# Patient Record
Sex: Male | Born: 1950 | Race: White | Hispanic: No | Marital: Married | Smoking: Never smoker
Health system: Southern US, Community
[De-identification: ages and names within clinical notes are randomized; demographics above are authoritative.]

## PROBLEM LIST (undated history)

## (undated) DIAGNOSIS — E785 Hyperlipidemia, unspecified: Secondary | ICD-10-CM

## (undated) DIAGNOSIS — I10 Essential (primary) hypertension: Secondary | ICD-10-CM

## (undated) DIAGNOSIS — D329 Benign neoplasm of meninges, unspecified: Secondary | ICD-10-CM

## (undated) DIAGNOSIS — J189 Pneumonia, unspecified organism: Secondary | ICD-10-CM

## (undated) DIAGNOSIS — G4734 Idiopathic sleep related nonobstructive alveolar hypoventilation: Secondary | ICD-10-CM

## (undated) DIAGNOSIS — I2510 Atherosclerotic heart disease of native coronary artery without angina pectoris: Secondary | ICD-10-CM

## (undated) DIAGNOSIS — D72829 Elevated white blood cell count, unspecified: Secondary | ICD-10-CM

## (undated) DIAGNOSIS — F439 Reaction to severe stress, unspecified: Secondary | ICD-10-CM

## (undated) DIAGNOSIS — Y95 Nosocomial condition: Secondary | ICD-10-CM

## (undated) DIAGNOSIS — I255 Ischemic cardiomyopathy: Secondary | ICD-10-CM

## (undated) HISTORY — DX: Nosocomial condition: Y95

## (undated) HISTORY — DX: Benign neoplasm of meninges, unspecified: D32.9

## (undated) HISTORY — DX: Pneumonia, unspecified organism: J18.9

## (undated) HISTORY — PX: COLONOSCOPY: SHX174

---

## 2001-11-11 ENCOUNTER — Ambulatory Visit (HOSPITAL_COMMUNITY): Admission: RE | Admit: 2001-11-11 | Discharge: 2001-11-11 | Payer: Self-pay | Admitting: Gastroenterology

## 2013-02-07 ENCOUNTER — Other Ambulatory Visit: Payer: Self-pay | Admitting: Family Medicine

## 2013-02-07 DIAGNOSIS — M5410 Radiculopathy, site unspecified: Secondary | ICD-10-CM

## 2013-02-11 ENCOUNTER — Other Ambulatory Visit: Payer: Self-pay

## 2013-02-28 HISTORY — PX: CARDIAC CATHETERIZATION: SHX172

## 2013-03-27 ENCOUNTER — Inpatient Hospital Stay (HOSPITAL_COMMUNITY)
Admission: EM | Admit: 2013-03-27 | Discharge: 2013-03-29 | DRG: 282 | Disposition: A | Discharge status: Home / Self Care | Payer: Managed Care, Other (non HMO) | Attending: Cardiovascular Disease | Admitting: Cardiovascular Disease

## 2013-03-27 ENCOUNTER — Emergency Department (HOSPITAL_COMMUNITY): Payer: Managed Care, Other (non HMO)

## 2013-03-27 ENCOUNTER — Encounter (HOSPITAL_COMMUNITY): Payer: Self-pay | Admitting: Emergency Medicine

## 2013-03-27 DIAGNOSIS — Z639 Problem related to primary support group, unspecified: Secondary | ICD-10-CM

## 2013-03-27 DIAGNOSIS — Z87891 Personal history of nicotine dependence: Secondary | ICD-10-CM

## 2013-03-27 DIAGNOSIS — Z7982 Long term (current) use of aspirin: Secondary | ICD-10-CM

## 2013-03-27 DIAGNOSIS — F439 Reaction to severe stress, unspecified: Secondary | ICD-10-CM

## 2013-03-27 DIAGNOSIS — Z566 Other physical and mental strain related to work: Secondary | ICD-10-CM

## 2013-03-27 DIAGNOSIS — Z79899 Other long term (current) drug therapy: Secondary | ICD-10-CM

## 2013-03-27 DIAGNOSIS — Z809 Family history of malignant neoplasm, unspecified: Secondary | ICD-10-CM

## 2013-03-27 DIAGNOSIS — Z880 Allergy status to penicillin: Secondary | ICD-10-CM

## 2013-03-27 DIAGNOSIS — E876 Hypokalemia: Secondary | ICD-10-CM | POA: Diagnosis present

## 2013-03-27 DIAGNOSIS — E785 Hyperlipidemia, unspecified: Secondary | ICD-10-CM | POA: Diagnosis present

## 2013-03-27 DIAGNOSIS — I2510 Atherosclerotic heart disease of native coronary artery without angina pectoris: Secondary | ICD-10-CM | POA: Diagnosis present

## 2013-03-27 DIAGNOSIS — I10 Essential (primary) hypertension: Secondary | ICD-10-CM | POA: Diagnosis present

## 2013-03-27 DIAGNOSIS — Z889 Allergy status to unspecified drugs, medicaments and biological substances status: Secondary | ICD-10-CM

## 2013-03-27 DIAGNOSIS — Z569 Unspecified problems related to employment: Secondary | ICD-10-CM

## 2013-03-27 DIAGNOSIS — Z23 Encounter for immunization: Secondary | ICD-10-CM

## 2013-03-27 DIAGNOSIS — D72829 Elevated white blood cell count, unspecified: Secondary | ICD-10-CM | POA: Diagnosis present

## 2013-03-27 DIAGNOSIS — I214 Non-ST elevation (NSTEMI) myocardial infarction: Principal | ICD-10-CM | POA: Diagnosis present

## 2013-03-27 HISTORY — DX: Reaction to severe stress, unspecified: F43.9

## 2013-03-27 HISTORY — DX: Idiopathic sleep related nonobstructive alveolar hypoventilation: G47.34

## 2013-03-27 HISTORY — DX: Hyperlipidemia, unspecified: E78.5

## 2013-03-27 HISTORY — DX: Atherosclerotic heart disease of native coronary artery without angina pectoris: I25.10

## 2013-03-27 HISTORY — DX: Essential (primary) hypertension: I10

## 2013-03-27 HISTORY — DX: Elevated white blood cell count, unspecified: D72.829

## 2013-03-27 HISTORY — DX: Ischemic cardiomyopathy: I25.5

## 2013-03-27 LAB — COMPREHENSIVE METABOLIC PANEL
ALT: 23 U/L (ref 0–53)
Albumin: 3.7 g/dL (ref 3.5–5.2)
Alkaline Phosphatase: 102 U/L (ref 39–117)
BUN: 15 mg/dL (ref 6–23)
CO2: 27 mEq/L (ref 19–32)
Chloride: 98 mEq/L (ref 96–112)
GFR calc Af Amer: 88 mL/min — ABNORMAL LOW (ref >90)
GFR calc non Af Amer: 76 mL/min — ABNORMAL LOW (ref >90)
Glucose, Bld: 154 mg/dL — ABNORMAL HIGH (ref 70–99)
Potassium: 3.9 mEq/L (ref 3.5–5.1)
Sodium: 136 mEq/L (ref 135–145)
Total Bilirubin: 0.5 mg/dL (ref 0.3–1.2)

## 2013-03-27 LAB — CBC WITH DIFFERENTIAL/PLATELET
Basophils Relative: 0 % (ref 0–1)
HCT: 41.4 % (ref 39.0–52.0)
Hemoglobin: 14.8 g/dL (ref 13.0–17.0)
Lymphocytes Relative: 23 % (ref 12–46)
Lymphs Abs: 2.7 10*3/uL (ref 0.7–4.0)
MCH: 31.2 pg (ref 26.0–34.0)
Monocytes Absolute: 0.6 10*3/uL (ref 0.1–1.0)
Monocytes Relative: 5 % (ref 3–12)
Neutro Abs: 8.3 10*3/uL — ABNORMAL HIGH (ref 1.7–7.7)
Neutrophils Relative %: 71 % (ref 43–77)
Platelets: 280 10*3/uL (ref 150–400)
RBC: 4.75 MIL/uL (ref 4.22–5.81)

## 2013-03-27 LAB — POCT I-STAT TROPONIN I

## 2013-03-27 LAB — HEMOGLOBIN A1C: Hgb A1c MFr Bld: 5.6 % (ref <5.7)

## 2013-03-27 LAB — TROPONIN I
Troponin I: 0.75 ng/mL (ref <0.30)
Troponin I: 3.75 ng/mL (ref <0.30)
Troponin I: 2.84 ng/mL (ref <0.30)

## 2013-03-27 LAB — MRSA PCR SCREENING: MRSA by PCR: NEGATIVE

## 2013-03-27 LAB — TSH: TSH: 1.239 u[IU]/mL (ref 0.350–4.500)

## 2013-03-27 MED ORDER — ACETAMINOPHEN 325 MG PO TABS
650.0000 mg | ORAL_TABLET | ORAL | Status: DC | PRN
  Administered 2013-03-27 (×2): 650 mg via ORAL
  Filled 2013-03-27 (×2): qty 2

## 2013-03-27 MED ORDER — ASPIRIN EC 81 MG PO TBEC
81.0000 mg | DELAYED_RELEASE_TABLET | Freq: Every day | ORAL | Status: DC
  Filled 2013-03-27: qty 1

## 2013-03-27 MED ORDER — HEPARIN BOLUS VIA INFUSION
2500.0000 [IU] | Freq: Once | INTRAVENOUS | Status: AC
  Administered 2013-03-27: 2500 [IU] via INTRAVENOUS
  Filled 2013-03-27: qty 2500

## 2013-03-27 MED ORDER — SODIUM CHLORIDE 0.9 % IV SOLN
INTRAVENOUS | Status: DC
Start: 2013-03-27 — End: 2013-03-29

## 2013-03-27 MED ORDER — NITROGLYCERIN IN D5W 200-5 MCG/ML-% IV SOLN
2.0000 ug/min | INTRAVENOUS | Status: DC
  Administered 2013-03-27: 5 ug/min via INTRAVENOUS

## 2013-03-27 MED ORDER — ASPIRIN 81 MG PO CHEW
324.0000 mg | CHEWABLE_TABLET | Freq: Once | ORAL | Status: AC
  Administered 2013-03-27: 324 mg via ORAL
  Filled 2013-03-27: qty 4

## 2013-03-27 MED ORDER — ATORVASTATIN CALCIUM 80 MG PO TABS
80.0000 mg | ORAL_TABLET | Freq: Every day | ORAL | Status: DC
  Administered 2013-03-27 – 2013-03-28 (×2): 80 mg via ORAL
  Filled 2013-03-27 (×3): qty 1

## 2013-03-27 MED ORDER — TRAMADOL HCL 50 MG PO TABS: 50.0000 mg | ORAL_TABLET | Freq: Four times a day (QID) | ORAL | Status: DC | PRN

## 2013-03-27 MED ORDER — ONDANSETRON HCL 4 MG/2ML IJ SOLN
4.0000 mg | Freq: Four times a day (QID) | INTRAMUSCULAR | Status: DC | PRN
  Administered 2013-03-27 – 2013-03-28 (×2): 4 mg via INTRAVENOUS
  Filled 2013-03-27 (×2): qty 2

## 2013-03-27 MED ORDER — NITROGLYCERIN IN D5W 200-5 MCG/ML-% IV SOLN: 10.0000 ug/min | INTRAVENOUS | Status: DC

## 2013-03-27 MED ORDER — ADULT MULTIVITAMIN W/MINERALS CH
1.0000 | ORAL_TABLET | Freq: Every day | ORAL | Status: DC
  Administered 2013-03-27 – 2013-03-28 (×2): 1 via ORAL
  Filled 2013-03-27 (×3): qty 1

## 2013-03-27 MED ORDER — HEPARIN (PORCINE) IN NACL 100-0.45 UNIT/ML-% IJ SOLN
1500.0000 [IU]/h | INTRAMUSCULAR | Status: DC
  Administered 2013-03-27: 1000 [IU]/h via INTRAVENOUS
  Administered 2013-03-28 (×2): 1500 [IU]/h via INTRAVENOUS
  Filled 2013-03-27 (×4): qty 250

## 2013-03-27 MED ORDER — OXYCODONE-ACETAMINOPHEN 5-325 MG PO TABS
1.0000 | ORAL_TABLET | Freq: Four times a day (QID) | ORAL | Status: DC | PRN
Start: 2013-03-27 — End: 2013-03-29
  Administered 2013-03-28: 2 via ORAL
  Filled 2013-03-27: qty 2

## 2013-03-27 MED ORDER — ASPIRIN 81 MG PO CHEW
81.0000 mg | CHEWABLE_TABLET | ORAL | Status: DC
  Filled 2013-03-27: qty 1

## 2013-03-27 MED ORDER — SODIUM CHLORIDE 0.9 % IV SOLN: 1.0000 mL/kg/h | INTRAVENOUS | Status: DC

## 2013-03-27 MED ORDER — MORPHINE SULFATE 2 MG/ML IJ SOLN
2.0000 mg | INTRAMUSCULAR | Status: DC | PRN
  Administered 2013-03-27: 2 mg via INTRAVENOUS
  Filled 2013-03-27: qty 1

## 2013-03-27 MED ORDER — HEPARIN BOLUS VIA INFUSION
4000.0000 [IU] | Freq: Once | INTRAVENOUS | Status: AC
  Administered 2013-03-27: 4000 [IU] via INTRAVENOUS
  Filled 2013-03-27: qty 4000

## 2013-03-27 MED ORDER — METOPROLOL TARTRATE 25 MG PO TABS
25.0000 mg | ORAL_TABLET | Freq: Two times a day (BID) | ORAL | Status: DC
  Administered 2013-03-27 – 2013-03-28 (×4): 25 mg via ORAL
  Filled 2013-03-27 (×6): qty 1

## 2013-03-27 MED ORDER — NITROGLYCERIN 0.4 MG SL SUBL: 0.4000 mg | SUBLINGUAL_TABLET | SUBLINGUAL | Status: DC | PRN

## 2013-03-27 MED ORDER — INFLUENZA VAC SPLIT QUAD 0.5 ML IM SUSP
0.5000 mL | INTRAMUSCULAR | Status: AC
  Administered 2013-03-28: 0.5 mL via INTRAMUSCULAR
  Filled 2013-03-27: qty 0.5

## 2013-03-27 MED ORDER — ASPIRIN EC 81 MG PO TBEC
81.0000 mg | DELAYED_RELEASE_TABLET | Freq: Every day | ORAL | Status: DC
  Administered 2013-03-29: 81 mg via ORAL
  Filled 2013-03-27: qty 1

## 2013-03-27 MED ORDER — LISINOPRIL 20 MG PO TABS
20.0000 mg | ORAL_TABLET | Freq: Every day | ORAL | Status: DC
  Administered 2013-03-27 – 2013-03-29 (×3): 20 mg via ORAL
  Filled 2013-03-27 (×3): qty 1

## 2013-03-27 MED ORDER — ALPRAZOLAM 0.25 MG PO TABS
0.2500 mg | ORAL_TABLET | Freq: Two times a day (BID) | ORAL | Status: DC | PRN
  Administered 2013-03-27: 0.25 mg via ORAL
  Filled 2013-03-27 (×2): qty 1

## 2013-03-27 MED ORDER — NITROGLYCERIN 0.4 MG SL SUBL
0.4000 mg | SUBLINGUAL_TABLET | SUBLINGUAL | Status: AC | PRN
  Administered 2013-03-27 (×3): 0.4 mg via SUBLINGUAL
  Filled 2013-03-27: qty 25

## 2013-03-27 MED ORDER — NITROGLYCERIN IN D5W 200-5 MCG/ML-% IV SOLN
INTRAVENOUS | Status: AC
  Filled 2013-03-27: qty 250

## 2013-03-27 NOTE — Progress Notes (Signed)
ANTICOAGULATION CONSULT NOTE - Follow Up Consult  Pharmacy Consult for heparin Indication: chest pain/ACS  Allergies  Allergen Reactions  . Codeine Rash  . Penicillins Rash  . Sulfa Antibiotics Rash    Patient Measurements: Height: 5\' 9"  (175.3 cm) Weight: 189 lb 13.1 oz (86.1 kg) IBW/kg (Calculated) : 70.7 Heparin Dosing Weight: 86 kg  Vital Signs: Temp: 97.8 F (36.6 C) (12/28 1600) Temp src: Oral (12/28 1600) BP: 140/84 mmHg (12/28 1900) Pulse Rate: 82 (12/28 1900)  Labs:  Recent Labs  03/27/13 0546 03/27/13 0630 03/27/13 1313 03/27/13 1728  HGB 14.8  --   --   --   HCT 41.4  --   --   --   PLT 280  --   --   --   APTT  --  30  --   --   HEPARINUNFRC  --   --   --  <0.10*  CREATININE 1.02  --   --   --   TROPONINI 0.75*  --  3.75*  --     Estimated Creatinine Clearance: 80.6 ml/min (by C-G formula based on Cr of 1.02).  Assessment: Patient is a 62 y.o M on heparin for NSTEMI with plan for cath on 12/29.  First heparin level now back undetectable.  Per RN, no issues with IV line and no bleeding noted.  Goal of Therapy:  Heparin level 0.3-0.7 units/ml Monitor platelets by anticoagulation protocol: Yes   Plan:  1) heparin 2500 units IV bolus x1, then increase drip to 1250 units/hr 2) check 6 hour heparin level  Tareka Jhaveri P 03/27/2013,7:48 PM

## 2013-03-27 NOTE — ED Provider Notes (Signed)
CSN: [id number]     Arrival date & time 03/27/13  0536 History   First MD Initiated Contact with Patient 03/27/13 [phone]     Chief Complaint  Patient presents with  . Chest Pain   (Consider location/radiation/quality/duration/timing/severity/associated sxs/prior Treatment) HPI Comments: 62 year old male presents with chest pressure since 11 PM last night (about 7 hours prior to arrival). Patient states that the pressure seemed eventually wane enough for him to go to sleep. When he woke up his pain had returned. About an hour after arrival he was walking down the stairs and the pain worsened, he felt dizzy and became pale and diaphoretic. Never had nausea or shortness of breath. The pressure is better than it was before prescribe a 7/10. His history hypertension. He also has a history of hyperlipidemia but states this was controlled by diet and exercise. He's never had any heart problems before.   Past Medical History  Diagnosis Date  . Hypertension    History reviewed. No pertinent past surgical history. No family history on file. History  Substance Use Topics  . Smoking status: Never Smoker   . Smokeless tobacco: Not on file  . Alcohol Use: Not on file    Review of Systems  Constitutional: Positive for diaphoresis. Negative for fever and chills.  Respiratory: Negative for shortness of breath.   Cardiovascular: Positive for chest pain. Negative for leg swelling.  Gastrointestinal: Negative for nausea, vomiting and abdominal pain.  All other systems reviewed and are negative.    Allergies  Codeine; Penicillins; and Sulfa antibiotics  Home Medications   Current Outpatient Rx  Name  Route  Sig  Dispense  Refill  . aspirin EC 81 MG tablet   Oral   Take 81 mg by mouth daily.         Marland Kitchen lisinopril (PRINIVIL,ZESTRIL) 20 MG tablet   Oral   Take 20 mg by mouth daily.         . Multiple Vitamin (MULTIVITAMIN WITH MINERALS) TABS tablet   Oral   Take 1 tablet by mouth daily.           BP 134/90  Pulse 71  Temp(Src) 97.8 F (36.6 C) (Oral)  Resp 20  Wt 190 lb (86.183 kg)  SpO2 96% Physical Exam  Nursing note and vitals reviewed. Constitutional: He is oriented to person, place, and time. He appears well-developed and well-nourished. No distress.  HENT:  Head: Normocephalic and atraumatic.  Right Ear: External ear normal.  Left Ear: External ear normal.  Nose: Nose normal.  Eyes: Right eye exhibits no discharge. Left eye exhibits no discharge.  Neck: Neck supple.  Cardiovascular: Normal rate, regular rhythm, normal heart sounds and intact distal pulses.   Pulmonary/Chest: Effort normal and breath sounds normal. He exhibits no tenderness.  Abdominal: Soft. There is no tenderness.  Musculoskeletal: He exhibits no edema.  Neurological: He is alert and oriented to person, place, and time.  Skin: Skin is warm and dry.    ED Course  Procedures (including critical care time) Labs Review Labs Reviewed  CBC WITH DIFFERENTIAL - Abnormal; Notable for the following:    WBC 11.6 (*)    Neutro Abs 8.3 (*)    All other components within normal limits  COMPREHENSIVE METABOLIC PANEL - Abnormal; Notable for the following:    Glucose, Bld 154 (*)    GFR calc non Af Amer 76 (*)    GFR calc Af Amer 88 (*)    All other components within normal  limits  POCT I-STAT TROPONIN I - Abnormal; Notable for the following:    Troponin i, poc 0.43 (*)    All other components within normal limits  TROPONIN I  APTT  HEPARIN LEVEL (UNFRACTIONATED)   Imaging Review No results found.  EKG Interpretation    Date/Time:  Sunday March 27 2013 06:17:56 EST Ventricular Rate:  71 PR Interval:  169 QRS Duration: 109 QT Interval:  403 QTC Calculation: 438 R Axis:   -32 Text Interpretation:  Sinus rhythm Left axis deviation Inferior Q waves noted Borderline T wave abnormalities No old tracing to compare Confirmed by Thirza Pellicano  MD, Fernado Brigante ([id number]) on 03/27/2013 6:26:28  AM           CRITICAL CARE Performed by: Pricilla Loveless T   Total critical care time: 45 minutes  Critical care time was exclusive of separately billable procedures and treating other patients.  Critical care was necessary to treat or prevent imminent or life-threatening deterioration.  Critical care was time spent personally by me on the following activities: development of treatment plan with patient and/or surrogate as well as nursing, discussions with consultants, evaluation of patient's response to treatment, examination of patient, obtaining history from patient or surrogate, ordering and performing treatments and interventions, ordering and review of laboratory studies, ordering and review of radiographic studies, pulse oximetry and re-evaluation of patient's condition.  MDM   1. NSTEMI (non-ST elevated myocardial infarction)    6:25 Patient's EKG nonspecific with inferior Q waves. No old. No signs of STEMI. Patient appears mildly uncomfortable, pain 7/10. Given his elevated troponin, this is c/w NSTEMI. His pain has improved since 5:00 AM on it's own. Will consult cards, given ASA and will give nitro and start on heparin.  7:05 cards paged again, still no response yet. Patient's pain has improved.  7:40 Patient's pain "gone". Patient appears comfortable. Cards repaged, no response yet.  7:55 Discussed with PA Ward Givens who agrees with treatment so far and accepts patient to go to [facility] CCU.  Audree Camel, MD 03/27/13 [phone]

## 2013-03-27 NOTE — ED Notes (Signed)
[facility] called and report given. °

## 2013-03-27 NOTE — ED Notes (Signed)
Pt states started with chest pain at 2300; continued thru night; described as pressure; states this morning walked downstairs got dizzy and almost fainted; pale and diaphoretic at that time per wife; same on arrival; no previous history of same

## 2013-03-27 NOTE — ED Notes (Signed)
Dr Criss Alvine notified of troponin .75; no orders given

## 2013-03-27 NOTE — Progress Notes (Signed)
ANTICOAGULATION CONSULT NOTE - Initial Consult  Pharmacy Consult for Heparin Indication: chest pain/ACS  Allergies  Allergen Reactions  . Codeine Rash  . Penicillins Rash  . Sulfa Antibiotics Rash    Patient Measurements: Weight: 190 lb (86.183 kg) Heparin Dosing Weight:   Vital Signs: Temp: 97.8 F (36.6 C) (12/28 0600) Temp src: Oral (12/28 0600) BP: 134/90 mmHg (12/28 0543) Pulse Rate: 71 (12/28 0543)  Labs:  Recent Labs  03/27/13 0546  HGB 14.8  HCT 41.4  PLT 280  CREATININE 1.02    CrCl is unknown because there is no height on file for the current visit.   Medical History: Past Medical History  Diagnosis Date  . Hypertension     Medications:  Infusions:  . heparin    . heparin      Assessment: Patient with CP.  No order anticoagulants noted on Med rec.  Goal of Therapy:  Heparin level 0.3-0.7 units/ml Monitor platelets by anticoagulation protocol: Yes   Plan:  Heparin bolus 4000 units iv x1 Heparin drip at 1000 units/hr Daily CBC/Heparin level Next heparin level at 1500.  Darlina Guys, Jacquenette Shone Crowford 03/27/2013,6:37 AM

## 2013-03-27 NOTE — H&P (Signed)
Patient ID: Christian Ball MRN: [id number], DOB/AGE: Aug 13, 1950   Admit date: 03/27/2013  Primary Physician: No primary provider on file. Primary Cardiologist: new to    Pt. Profile:  62 y/o male w/o prior cardiac hx who presented to [facility] ED today with chest pain and has been found to have an elevated troponin.   Problem List  Past Medical History  Diagnosis Date  . Hypertension   . Hyperlipidemia     History reviewed. No pertinent past surgical history.   Allergies  Allergies  Allergen Reactions  . Codeine Rash  . Penicillins Rash  . Sulfa Antibiotics Rash   HPI  62 y/o male w/o prior cardiac hx.  He was in his usoh until last night around 11pm, when he noted sscp with belching, which he thought was related to indigestion.  This resolved within 15 minutes and he was able to fall off to sleep.  He awoke at about 5 AM needing to use the bathroom and upon returning to bed, he noted recurrent sscp.  He went and sat down and initially felt a little better but then developed diaphoresis, nausea, and presyncope.  At that point, he alerted his family who brought him to the ER.  There, he was treated with 3 sl ntg with complete relief of chest pain.  ECG showed inf q's.  Initial POC troponin was elevated at 0.43.  Subsequent troponin sent to lab returned at 0.75.   He was placed on heparin and transferred to [facility] CCU for admission.  Here, he is currently pain free.  Home Medications  Prior to Admission medications   Medication Sig Start Date End Date Taking? Authorizing Provider  aspirin EC 81 MG tablet Take 81 mg by mouth daily.   Yes Historical Provider, MD  lisinopril (PRINIVIL,ZESTRIL) 20 MG tablet Take 20 mg by mouth daily.   Yes Historical Provider, MD  Multiple Vitamin (MULTIVITAMIN WITH MINERALS) TABS tablet Take 1 tablet by mouth daily.   Yes Historical Provider, MD   Family History  Family History  Problem Relation Age of Onset  . Cancer Mother     died at 34  . GI  Bleed Father     died at 33   Social History  History   Social History  . Marital Status: Married    Spouse Name: N/A    Number of Children: N/A  . Years of Education: N/A   Occupational History  . Not on file.   Social History Main Topics  . Smoking status: Former Games developer  . Smokeless tobacco: Not on file     Comment: has had occasional cigar.  . Alcohol Use: Yes     Comment: 2 drinks/year  . Drug Use: No  . Sexual Activity: Yes   Other Topics Concern  . Not on file   Social History Narrative   Lives in [place] with his wife.  He works in Airline pilot - high stress.  Not currently exercising on routine basis.    Review of Systems General:  No chills, fever, night sweats or weight changes.  Cardiovascular:  +++ chest pain, diaphoresis, nausea, presyncope this AM.  No h/o dyspnea on exertion, edema, orthopnea, palpitations, paroxysmal nocturnal dyspnea. Dermatological: No rash, lesions/masses Respiratory: No cough, dyspnea Urologic: No hematuria, dysuria Abdominal:   No nausea, vomiting, diarrhea, bright red blood per rectum, melena, or hematemesis Neurologic:  No visual changes, wkns, changes in mental status. All other systems reviewed and are otherwise negative except as  noted above.  Physical Exam  Blood pressure 120/81, pulse 76, temperature 97.8 F (36.6 C), temperature source Oral, resp. rate 18, height 5\' 9"  (1.753 m), weight 190 lb (86.183 kg), SpO2 98.00%.  General: Pleasant, NAD Psych: Normal affect. Neuro: Alert and oriented X 3. Moves all extremities spontaneously. HEENT: Normal  Neck: Supple without bruits or JVD. Lungs:  Resp regular and unlabored, CTA. Heart: RRR no s3, s4, or murmurs. Abdomen: Soft, non-tender, non-distended, BS + x 4.  Extremities: No clubbing, cyanosis or edema. DP/PT/Radials 2+ and equal bilaterally.  Labs  Troponin [facility] Test)  Recent Labs  03/27/13 0600  TROPIPOC 0.43*     Recent Labs  03/27/13 0546  TROPONINI  0.75*   Lab Results  Component Value Date   WBC 11.6* 03/27/2013   HGB 14.8 03/27/2013   HCT 41.4 03/27/2013   MCV 87.2 03/27/2013   PLT 280 03/27/2013     Recent Labs Lab 03/27/13 0546  NA 136  K 3.9  CL 98  CO2 27  BUN 15  CREATININE 1.02  CALCIUM 10.5  PROT 7.4  BILITOT 0.5  ALKPHOS 102  ALT 23  AST 30  GLUCOSE 154*   Radiology/Studies  Dg Chest Portable 1 View  03/27/2013   CLINICAL DATA:  Chest pain and dyspnea with history of hypertension  EXAM: PORTABLE CHEST - 1 VIEW   IMPRESSION: There is mild bilateral pulmonary hypo inflation. There is no evidence of pneumonia nor CHF. When the patient can tolerate her procedure, a PA and lateral chest x-ray with deep inspiration would be useful if chest discomfort process.   Electronically Signed   By: David  [place]   On: 03/27/2013 07:07   ECG  Rsr, 71, inf q's, left axis.  ASSESSMENT AND PLAN  1.  NSTEMI:  Pt presented to the ED this AM following an episode of chest pain last night with recurrence of chest pain this AM followed by nausea, diaphoresis, and presyncope.  Ss resolved with ntg in the ER.  He is currently pain free.  ECG shows inf q's with inflat t flattening but no acute ST elevation or depression.  Trop is elevated @ 0.75.  We will admit to CCU, cycle CE, add asa, bb, high potency statin, cont heparin and home dose of acei.  Plan cath tomorrow or sooner if he has recurrent/recalcitrant chest pain.  Check echo today.  Eventual cardiac rehab.  2.  HTN:  BP currently elevated.  Resume home dose of acie. Add bb.  Follow.  3.  HL:  Pt reports h/o borderline HL.  Check lipids/lft's.  Add lipitor 80 in setting of NSTEMI.  Signed, Nicolasa Ducking, NP 03/27/2013, 10:21 AM

## 2013-03-27 NOTE — H&P (Signed)
The patient was seen and examined, and I agree with the assessment and plan as documented above, with modifications as noted below. Pt had one similar prior episode approximately 3 weeks ago, after eating a very similar heavy [place] meal, and attributed it to indigestion. Had been exercising and losing weight up until one year ago, and since that time has not done so. He has a type A personality as per both he and his wife, and is in the medical/surgical equipment sales business, and work has been exceedingly stressful as of late. Also has twin daughters who are getting married within 6 weeks of each other. He was experiencing chest pain during my examination, and will start him on a nitro drip. Plan for cath on 12/29 with echo today. ASA, statin, beta blocker, ACEI, and heparin for now.

## 2013-03-28 ENCOUNTER — Encounter (HOSPITAL_COMMUNITY)
Admission: EM | Disposition: A | Discharge status: Home / Self Care | Payer: Self-pay | Source: Home / Self Care | Attending: Cardiovascular Disease

## 2013-03-28 DIAGNOSIS — I059 Rheumatic mitral valve disease, unspecified: Secondary | ICD-10-CM

## 2013-03-28 DIAGNOSIS — I2510 Atherosclerotic heart disease of native coronary artery without angina pectoris: Secondary | ICD-10-CM

## 2013-03-28 HISTORY — PX: LEFT HEART CATHETERIZATION WITH CORONARY ANGIOGRAM: SHX5451

## 2013-03-28 LAB — LIPID PANEL
Cholesterol: 146 mg/dL (ref 0–200)
HDL: 38 mg/dL — ABNORMAL LOW (ref >39)
LDL Cholesterol: 80 mg/dL (ref 0–99)
Total CHOL/HDL Ratio: 3.8 RATIO
VLDL: 28 mg/dL (ref 0–40)

## 2013-03-28 LAB — BASIC METABOLIC PANEL
CO2: 27 mEq/L (ref 19–32)
Calcium: 8.8 mg/dL (ref 8.4–10.5)
Chloride: 103 mEq/L (ref 96–112)
Creatinine, Ser: 1.05 mg/dL (ref 0.50–1.35)
Glucose, Bld: 93 mg/dL (ref 70–99)
Sodium: 142 mEq/L (ref 135–145)

## 2013-03-28 LAB — CBC
Hemoglobin: 13.1 g/dL (ref 13.0–17.0)
MCH: 31.3 pg (ref 26.0–34.0)
MCV: 90.2 fL (ref 78.0–100.0)
RBC: 4.18 MIL/uL — ABNORMAL LOW (ref 4.22–5.81)

## 2013-03-28 LAB — HEPARIN LEVEL (UNFRACTIONATED): Heparin Unfractionated: 0.41 IU/mL (ref 0.30–0.70)

## 2013-03-28 LAB — PROTIME-INR
INR: 1.1 (ref 0.00–1.49)
Prothrombin Time: 14 seconds (ref 11.6–15.2)

## 2013-03-28 SURGERY — LEFT HEART CATHETERIZATION WITH CORONARY ANGIOGRAM
Anesthesia: LOCAL

## 2013-03-28 MED ORDER — ASPIRIN 81 MG PO CHEW: 81.0000 mg | CHEWABLE_TABLET | ORAL | Status: DC

## 2013-03-28 MED ORDER — SODIUM CHLORIDE 0.9 % IV SOLN
INTRAVENOUS | Status: AC
  Administered 2013-03-28: 18:00:00 via INTRAVENOUS

## 2013-03-28 MED ORDER — MIDAZOLAM HCL 2 MG/2ML IJ SOLN
INTRAMUSCULAR | Status: AC
  Filled 2013-03-28: qty 2

## 2013-03-28 MED ORDER — LIDOCAINE HCL (PF) 1 % IJ SOLN
INTRAMUSCULAR | Status: AC
  Filled 2013-03-28: qty 30

## 2013-03-28 MED ORDER — HEART ATTACK BOUNCING BOOK
Freq: Once | Status: AC
  Administered 2013-03-28: 22:00:00
  Filled 2013-03-28: qty 1

## 2013-03-28 MED ORDER — HEPARIN BOLUS VIA INFUSION
3000.0000 [IU] | Freq: Once | INTRAVENOUS | Status: AC
  Administered 2013-03-28: 3000 [IU] via INTRAVENOUS
  Filled 2013-03-28: qty 3000

## 2013-03-28 MED ORDER — NITROGLYCERIN 0.2 MG/ML ON CALL CATH LAB
INTRAVENOUS | Status: AC
  Filled 2013-03-28: qty 1

## 2013-03-28 MED ORDER — CLOPIDOGREL BISULFATE 75 MG PO TABS
75.0000 mg | ORAL_TABLET | Freq: Every day | ORAL | Status: DC
  Administered 2013-03-29: 10:00:00 75 mg via ORAL
  Filled 2013-03-28: qty 1

## 2013-03-28 MED ORDER — SODIUM CHLORIDE 0.9 % IV SOLN: 1.0000 mL/kg/h | INTRAVENOUS | Status: DC

## 2013-03-28 MED ORDER — DIAZEPAM 5 MG PO TABS
5.0000 mg | ORAL_TABLET | ORAL | Status: AC
  Administered 2013-03-28: 5 mg via ORAL
  Filled 2013-03-28: qty 1

## 2013-03-28 MED ORDER — ISOSORBIDE MONONITRATE ER 30 MG PO TB24
30.0000 mg | ORAL_TABLET | Freq: Every day | ORAL | Status: DC
  Administered 2013-03-28 – 2013-03-29 (×2): 30 mg via ORAL
  Filled 2013-03-28 (×2): qty 1

## 2013-03-28 MED ORDER — ASPIRIN 81 MG PO CHEW
324.0000 mg | CHEWABLE_TABLET | Freq: Once | ORAL | Status: AC
  Administered 2013-03-28: 324 mg via ORAL

## 2013-03-28 MED ORDER — HEPARIN SODIUM (PORCINE) 1000 UNIT/ML IJ SOLN
INTRAMUSCULAR | Status: AC
  Filled 2013-03-28: qty 1

## 2013-03-28 MED ORDER — VERAPAMIL HCL 2.5 MG/ML IV SOLN
INTRAVENOUS | Status: AC
  Filled 2013-03-28: qty 2

## 2013-03-28 MED ORDER — HEPARIN (PORCINE) IN NACL 2-0.9 UNIT/ML-% IJ SOLN
INTRAMUSCULAR | Status: AC
  Filled 2013-03-28: qty 1500

## 2013-03-28 MED ORDER — FENTANYL CITRATE 0.05 MG/ML IJ SOLN
INTRAMUSCULAR | Status: AC
  Filled 2013-03-28: qty 2

## 2013-03-28 NOTE — Interval H&P Note (Signed)
History and Physical Interval Note:  03/28/2013 2:12 PM  Christian Ball  has presented today for surgery, with the diagnosis of cp  The various methods of treatment have been discussed with the patient and family. After consideration of risks, benefits and other options for treatment, the patient has consented to  Procedure(s): LEFT HEART CATHETERIZATION WITH CORONARY ANGIOGRAM (N/A) as a surgical intervention .  The patient's history has been reviewed, patient examined, no change in status, stable for surgery.  I have reviewed the patient's chart and labs.  Questions were answered to the patient's satisfaction.   Cath Lab Visit (complete for each Cath Lab visit)  Clinical Evaluation Leading to the Procedure:   ACS: yes  Non-ACS:    Anginal Classification: CCS IV  Anti-ischemic medical therapy: No Therapy  Non-Invasive Test Results: No non-invasive testing performed  Prior CABG: No previous CABG        Theron Arista [facility] 03/28/2013 2:12 PM

## 2013-03-28 NOTE — Care Management Note (Signed)
    Page 1 of 1   03/28/2013     8:59:08 AM   CARE MANAGEMENT NOTE 03/28/2013  Patient:  Christian, Ball   Account Number:  [id number]  Date Initiated:  03/28/2013  Documentation initiated by:  Junius Creamer  Subjective/Objective Assessment:   adm w mi     Action/Plan:   lives w wife   Anticipated DC Date:     Anticipated DC Plan:  HOME/SELF CARE         Choice offered to / List presented to:             Status of service:   Medicare Important Message given?   (If response is "NO", the following Medicare IM given date fields will be blank) Date Medicare IM given:   Date Additional Medicare IM given:    Discharge Disposition:  HOME/SELF CARE  Per UR Regulation:  Reviewed for med. necessity/level of care/duration of stay  If discussed at Long Length of Stay Meetings, dates discussed:    Comments:

## 2013-03-28 NOTE — Progress Notes (Signed)
  Echocardiogram 2D Echocardiogram has been performed.  Jorje Guild 03/28/2013, 9:23 AM

## 2013-03-28 NOTE — CV Procedure (Signed)
    Cardiac Catheterization Procedure Note  Name: Christian Ball MRN: [id number] DOB: 02-19-1951  Procedure: Left Heart Cath, Selective Coronary Angiography, LV angiography  Indication: 62 yo WM presents with a NSTEMI   Procedural Details: The right wrist was prepped, draped, and anesthetized with 1% lidocaine. Using the modified Seldinger technique, a 5 French sheath was introduced into the right radial artery. 3 mg of verapamil was administered through the sheath, weight-based unfractionated heparin was administered intravenously. Standard Judkins catheters were used for selective coronary angiography and left ventriculography. Catheter exchanges were performed over an exchange length guidewire. There were no immediate procedural complications. A TR band was used for radial hemostasis at the completion of the procedure.  The patient was transferred to the post catheterization recovery area for further monitoring.  Procedural Findings: Hemodynamics: AO 112/77 mean 94 mm Hg LV 112/20 mm Hg  Coronary angiography: Coronary dominance: right  Left mainstem: Normal  Left anterior descending (LAD): 20-30% in the mid vessel at the takeoff of D2. The first diagonal is very small and normal. The second diagonal is large and normal.   Left circumflex (LCx): The LCx gives rise to a large OM1 then terminates in a small OM2. It is normal.  Right coronary artery (RCA): The RCA is a large, tortuous vessel. There is a long 80% stenosis in the second PLOM. There are left to right collaterals to the PLOM.  Left ventriculography: Left ventricular systolic function is normal, LVEF is estimated at 55-65%, there is no significant mitral regurgitation   Final Conclusions:   1. 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 2. Normal LV function.  Recommendations: Optimize medical therapy. Will treat with ASA, Plavix, statin, metoprolol, and nitrates. Overall prognosis is good.  If patient has refractory symptoms could consider PCI of the PLOM.  Theron Arista [facility] 03/28/2013, 2:47 PM

## 2013-03-28 NOTE — Progress Notes (Deleted)
Since cardioversion, pt's sats have continued to drop and not as responsive as prior to procedure. Order given to administer Romazicon. Given to pt and pt immediately responsive and able to answer questions. Pt continues to require 100% NRB to have sats above 91%. Sats only accurate when taken on ear lobe since pt has poor peripheral circulation.  

## 2013-03-28 NOTE — Progress Notes (Signed)
TR BAND REMOVAL  LOCATION:    right radial  DEFLATED PER PROTOCOL:    yes  TIME BAND OFF / DRESSING APPLIED:    1700   SITE UPON ARRIVAL:    Level 0  SITE AFTER BAND REMOVAL:    Level 0  REVERSE ALLEN'S TEST:     positive  CIRCULATION SENSATION AND MOVEMENT:    Within Normal Limits   yes  COMMENTS:   Right radial gauze dressing applied at 1700. Rechecked at 1730, right radial and ulnar pulses palpable, CSMs wnls, dressing dry and intact.

## 2013-03-28 NOTE — Progress Notes (Signed)
ANTICOAGULATION CONSULT NOTE - Follow Up Consult  Pharmacy Consult for Heparin  Indication: chest pain/ACS  Allergies  Allergen Reactions  . Codeine Rash  . Penicillins Rash  . Sulfa Antibiotics Rash   Patient Measurements: Height: 5\' 9"  (175.3 cm) Weight: 189 lb 13.1 oz (86.1 kg) IBW/kg (Calculated) : 70.7  Vital Signs: Temp: 97.8 F (36.6 C) (12/29 0000) Temp src: Oral (12/29 0000) BP: 115/81 mmHg (12/29 0000) Pulse Rate: 60 (12/29 0000)  Labs:  Recent Labs  03/27/13 0546 03/27/13 0630 03/27/13 1313 03/27/13 1728 03/27/13 2225 03/28/13 0205  HGB 14.8  --   --   --   --   --   HCT 41.4  --   --   --   --   --   PLT 280  --   --   --   --   --   APTT  --  30  --   --   --   --   HEPARINUNFRC  --   --   --  <0.10*  --  <0.10*  CREATININE 1.02  --   --   --   --   --   TROPONINI 0.75*  --  3.75*  --  2.84*  --    Estimated Creatinine Clearance: 80.6 ml/min (by C-G formula based on Cr of 1.02).  Medications:  Heparin 1250 units/hr  Assessment: 62 y/o M on heparin for NSTEMI now with two undetectable heparin levels. Other labs as above.   Goal of Therapy:  Heparin level 0.3-0.7 units/ml Monitor platelets by anticoagulation protocol: Yes   Plan:  -Heparin 3000 units BOLUS x 1 -Increase heparin drip to 1500 units/hr -6 hour HL at 0930 -Daily CBC/HL -Monitor for bleeding  Thank you for allowing me to take part in this patient's care,  Abran Duke, PharmD Clinical Pharmacist Phone: [phone] Pager: [phone] 03/28/2013 2:52 AM

## 2013-03-28 NOTE — Progress Notes (Signed)
ANTICOAGULATION CONSULT NOTE - Follow Up Consult  Pharmacy Consult for Heparin  Indication: chest pain/ACS  Allergies  Allergen Reactions  . Codeine Rash  . Penicillins Rash  . Sulfa Antibiotics Rash   Patient Measurements: Height: 5\' 9"  (175.3 cm) Weight: 188 lb 15 oz (85.7 kg) IBW/kg (Calculated) : 70.7  Vital Signs: Temp: 98.4 F (36.9 C) (12/29 0800) Temp src: Oral (12/29 0800) BP: 103/78 mmHg (12/29 1000) Pulse Rate: 63 (12/29 1000)  Labs:  Recent Labs  03/27/13 0546 03/27/13 0630 03/27/13 1313  03/27/13 2225 03/28/13 0205 03/28/13 0407 03/28/13 0920  HGB 14.8  --   --   --   --   --  13.1  --   HCT 41.4  --   --   --   --   --  37.7*  --   PLT 280  --   --   --   --   --  247  --   APTT  --  30  --   --   --   --   --   --   LABPROT  --   --   --   --   --   --  14.0  --   INR  --   --   --   --   --   --  1.10  --   HEPARINUNFRC  --   --   --   < >  --  <0.10* 0.61 0.41  CREATININE 1.02  --   --   --   --   --  1.05  --   TROPONINI 0.75*  --  3.75*  --  2.84* 4.31*  --   --   < > = values in this interval not displayed. Estimated Creatinine Clearance: 78.1 ml/min (by C-G formula based on Cr of 1.05).  Medications:  Heparin 1500 units/hr  Assessment: 62 y/o M on heparin for NSTEMI and heparin level is at goal (HL= 0.41). Patient noted for cath today.  Goal of Therapy:  Heparin level 0.3-0.7 units/ml Monitor platelets by anticoagulation protocol: Yes   Plan:  -No heparin changes needed -Will follow post cath  Harland German, Pharm D 03/28/2013 11:11 AM

## 2013-03-28 NOTE — Progress Notes (Signed)
     Patient Name: Christian Ball      SUBJECTIVE:admitted yesterday with NSTEMI with + bump in Troponin again this am after initial downward trend No chest pain through night     Past Medical History  Diagnosis Date  . Hypertension   . Hyperlipidemia     Scheduled Meds:  Scheduled Meds: . aspirin  81 mg Oral Pre-Cath  . [START ON 03/29/2013] aspirin EC  81 mg Oral Daily  . atorvastatin  80 mg Oral q1800  . influenza vac split quadrivalent PF  0.5 mL Intramuscular Tomorrow-1000  . lisinopril  20 mg Oral Daily  . metoprolol tartrate  25 mg Oral BID  . multivitamin with minerals  1 tablet Oral Daily   Continuous Infusions: . sodium chloride Stopped (03/28/13 0400)  . sodium chloride 1 mL/kg/hr (03/28/13 0400)  . heparin 1,500 Units/hr (03/28/13 0348)  . nitroGLYCERIN 20 mcg/min (03/28/13 0515)    PHYSICAL EXAM Filed Vitals:   03/28/13 0645 03/28/13 0700 03/28/13 0715 03/28/13 0730  BP: 126/90 117/86 116/71 132/79  Pulse: 63 64 60 58  Temp:      TempSrc:      Resp: 16 10 13 14  Height:      Weight:      SpO2: 96% 97% 96% 97%    Well developed and nourished in no acute distress HENT normal Neck supple with JVP-flat Clear Regular rate and rhythm, no murmurs or gallops Abd-soft with active BS No Clubbing cyanosis edema Skin-warm and dry A & Oriented  Grossly normal sensory and motor function   TELEMETRY: Reviewed telemetry pt in NSR   Intake/Output Summary (Last 24 hours) at 03/28/13 0737 Last data filed at 03/28/13 0600  Gross per 24 hour  Intake 1128.37 ml  Output   2050 ml  Net -921.63 ml    LABS: Basic Metabolic Panel:  Recent Labs Lab 03/27/13 0546 03/28/13 0407  NA 136 142  K 3.9 3.4*  CL 98 103  CO2 27 27  GLUCOSE 154* 93  BUN 15 13  CREATININE 1.02 1.05  CALCIUM 10.5 8.8   Cardiac Enzymes:  Recent Labs  03/27/13 1313 03/27/13 2225 03/28/13 0205  TROPONINI 3.75* 2.84* 4.31*   CBC:  Recent Labs Lab 03/27/13 0546  03/28/13 0407  WBC 11.6* 11.5*  NEUTROABS 8.3*  --   HGB 14.8 13.1  HCT 41.4 37.7*  MCV 87.2 90.2  PLT 280 247   PROTIME:  Recent Labs  03/28/13 0407  LABPROT 14.0  INR 1.10   Liver Function Tests:  Recent Labs  03/27/13 0546  AST 30  ALT 23  ALKPHOS 102  BILITOT 0.5  PROT 7.4  ALBUMIN 3.7   No results found for this basename: LIPASE, AMYLASE,  in the last 72 hours BNP: BNP (last 3 results) No results found for this basename: PROBNP,  in the last 8760 hours D-Dimer: No results found for this basename: DDIMER,  in the last 72 hours Hemoglobin A1C:  Recent Labs  03/27/13 1313  HGBA1C 5.6   Fasting Lipid Panel:  Recent Labs  03/28/13 0407  CHOL 146  HDL 38*  LDLCALC 80  TRIG 138  CHOLHDL 3.8   Thyroid Function Tests:  Recent Labs  03/27/13 1313  TSH 1.239   Anemia Panel: No results found for this basename: VITAMINB12, FOLATE, FERRITIN, TIBC, IRON, RETICCTPCT,  in the last 72 hours    ASSESSMENT AND PLAN:  Active Problems:   NSTEMI (non-ST elevated myocardial infarction)    Enzyme bump through the night, but without symptoms Have reviewed with Dr PJ  Will plan cath later today unless symptoms dictate  Signed, Steven Klein MD  03/28/2013   

## 2013-03-28 NOTE — H&P (View-Only) (Signed)
Patient Name: Christian Ball      SUBJECTIVE:admitted yesterday with NSTEMI with + bump in Troponin again this am after initial downward trend No chest pain through night     Past Medical History  Diagnosis Date  . Hypertension   . Hyperlipidemia     Scheduled Meds:  Scheduled Meds: . aspirin  81 mg Oral Pre-Cath  . [START ON 03/29/2013] aspirin EC  81 mg Oral Daily  . atorvastatin  80 mg Oral q1800  . influenza vac split quadrivalent PF  0.5 mL Intramuscular Tomorrow-1000  . lisinopril  20 mg Oral Daily  . metoprolol tartrate  25 mg Oral BID  . multivitamin with minerals  1 tablet Oral Daily   Continuous Infusions: . sodium chloride Stopped (03/28/13 0400)  . sodium chloride 1 mL/kg/hr (03/28/13 0400)  . heparin 1,500 Units/hr (03/28/13 0348)  . nitroGLYCERIN 20 mcg/min (03/28/13 0515)    PHYSICAL EXAM Filed Vitals:   03/28/13 0645 03/28/13 0700 03/28/13 0715 03/28/13 0730  BP: 126/90 117/86 116/71 132/79  Pulse: 63 64 60 58  Temp:      TempSrc:      Resp: 16 10 13 14   Height:      Weight:      SpO2: 96% 97% 96% 97%    Well developed and nourished in no acute distress HENT normal Neck supple with JVP-flat Clear Regular rate and rhythm, no murmurs or gallops Abd-soft with active BS No Clubbing cyanosis edema Skin-warm and dry A & Oriented  Grossly normal sensory and motor function   TELEMETRY: Reviewed telemetry pt in NSR   Intake/Output Summary (Last 24 hours) at 03/28/13 0737 Last data filed at 03/28/13 0600  Gross per 24 hour  Intake 1128.37 ml  Output   2050 ml  Net -921.63 ml    LABS: Basic Metabolic Panel:  Recent Labs Lab 03/27/13 0546 03/28/13 0407  NA 136 142  K 3.9 3.4*  CL 98 103  CO2 27 27  GLUCOSE 154* 93  BUN 15 13  CREATININE 1.02 1.05  CALCIUM 10.5 8.8   Cardiac Enzymes:  Recent Labs  03/27/13 1313 03/27/13 2225 03/28/13 0205  TROPONINI 3.75* 2.84* 4.31*   CBC:  Recent Labs Lab 03/27/13 0546  03/28/13 0407  WBC 11.6* 11.5*  NEUTROABS 8.3*  --   HGB 14.8 13.1  HCT 41.4 37.7*  MCV 87.2 90.2  PLT 280 247   PROTIME:  Recent Labs  03/28/13 0407  LABPROT 14.0  INR 1.10   Liver Function Tests:  Recent Labs  03/27/13 0546  AST 30  ALT 23  ALKPHOS 102  BILITOT 0.5  PROT 7.4  ALBUMIN 3.7   No results found for this basename: LIPASE, AMYLASE,  in the last 72 hours BNP: BNP (last 3 results) No results found for this basename: PROBNP,  in the last 8760 hours D-Dimer: No results found for this basename: DDIMER,  in the last 72 hours Hemoglobin A1C:  Recent Labs  03/27/13 1313  HGBA1C 5.6   Fasting Lipid Panel:  Recent Labs  03/28/13 0407  CHOL 146  HDL 38*  LDLCALC 80  TRIG 478  CHOLHDL 3.8   Thyroid Function Tests:  Recent Labs  03/27/13 1313  TSH 1.239   Anemia Panel: No results found for this basename: VITAMINB12, FOLATE, FERRITIN, TIBC, IRON, RETICCTPCT,  in the last 72 hours    ASSESSMENT AND PLAN:  Active Problems:   NSTEMI (non-ST elevated myocardial infarction)  Enzyme bump through the night, but without symptoms Have reviewed with Dr Garth Bigness  Will plan cath later today unless symptoms dictate  Signed, Sherryl Manges MD  03/28/2013

## 2013-03-29 ENCOUNTER — Encounter (HOSPITAL_COMMUNITY): Payer: Self-pay | Admitting: Physician Assistant

## 2013-03-29 LAB — BASIC METABOLIC PANEL
Calcium: 8.4 mg/dL (ref 8.4–10.5)
Chloride: 102 mEq/L (ref 96–112)
Creatinine, Ser: 1 mg/dL (ref 0.50–1.35)
GFR calc Af Amer: >90 mL/min (ref >90)
GFR calc non Af Amer: 78 mL/min — ABNORMAL LOW (ref >90)
Potassium: 3.7 mEq/L (ref 3.7–5.3)
Sodium: 141 mEq/L (ref 137–147)

## 2013-03-29 LAB — CBC
MCH: 30.9 pg (ref 26.0–34.0)
MCV: 91.5 fL (ref 78.0–100.0)
Platelets: 238 10*3/uL (ref 150–400)
RBC: 3.76 MIL/uL — ABNORMAL LOW (ref 4.22–5.81)
RDW: 13.7 % (ref 11.5–15.5)

## 2013-03-29 LAB — PROTIME-INR
INR: 1 (ref 0.00–1.49)
Prothrombin Time: 13 seconds (ref 11.6–15.2)

## 2013-03-29 MED ORDER — ATORVASTATIN CALCIUM 80 MG PO TABS: 80.0000 mg | ORAL_TABLET | Freq: Every evening | ORAL | Status: DC

## 2013-03-29 MED ORDER — ISOSORBIDE MONONITRATE ER 30 MG PO TB24: 30.0000 mg | ORAL_TABLET | Freq: Every day | ORAL | Status: DC

## 2013-03-29 MED ORDER — CLOPIDOGREL BISULFATE 75 MG PO TABS: 75.0000 mg | ORAL_TABLET | Freq: Every day | ORAL | Status: DC

## 2013-03-29 MED ORDER — NITROGLYCERIN 0.4 MG SL SUBL: 0.4000 mg | SUBLINGUAL_TABLET | SUBLINGUAL | Status: AC | PRN

## 2013-03-29 NOTE — Discharge Summary (Signed)
Discharge Summary   Patient ID: Christian Ball MRN: [id number], DOB/AGE: [birth year] 62 y.o. Admit date: 03/27/2013 D/C date:     03/29/2013  Primary Care Provider: No primary provider on file. Primary Cardiologist: [place]  Primary Discharge Diagnoses:  1. NSTEMI/newly diagnosed CAD - 2nd PLOM branch with L-R collaterals, for medical rx. Consider PCI if refractory sx 2. Dyslipidemia 3. Leukocytosis, may be reactive to MI 4. Nocturnal desaturation  5. ICM EF 45-50% by echo, 55-65% by cath 6. Stress, situational 7. HTN  Hospital Course: Christian Ball is a 62 y/o M with history of HTN, HL who presented initially to [facility] 03/27/13 with complaints of chest pain. He has no prior cardiac history. He was in [facility] until the evening prior to admission when he noted SSCP with belching, which he thought was related to indigestion. This resolved within 15 minutes and he was able to fall off to sleep. He awoke at about 5 AM needing to use the bathroom and upon returning to bed, he noted recurrent SSCP. This improved but then he developed diaphoresis, nausea, and presyncope. He came to the ER where he received 3 SL NTG with complete relief of CP. Initial POC troponin was elevated and he was placed on IV heparin. He was transferred to [facility] for cardiac admission. ECG showed inf q's with inflat t flattening but no acute ST elevation or depression. He was placed on beta blocker, statin and cardiac cath was recommended. He underwent this procedure showing single vessel obstructive CAD involving the second PLOM branch of the RCA, with L-R collaterals to the PLOM. Residual 20-30% mid LAD disease, otherwise normal LCx. Medical therapy was recommended, reserving PCI for refractory symptoms. He was placed on Plavix and Imdur. Peak troponin 4.31. EF was 55-65%. 2D echo was obtained showing basal inferior and inferolateral hypokinesis and mid inferior wall hypokinesis, EF 45-50%, mild MR. Christian Ball tolerated this  procedure well. Beta blocker was later discontinued by Dr. Graciela Husbands due to relative hypotension (BP 1-teens prior to meds) - can reconsider as outpatient. ACEI was continued. The patient ambulated well with cardiac rehab. Dr. Graciela Husbands has seen and examined the patient today and feels he is stable for discharge.  Additional issues this admission: - consider OP f/u labs given statin initiation. - WBC was mildly elevated in 11-12 range without evidence for infection. May be due to MI. Pt instructed to f/u PCP for eventual recheck. - The patient had occasional nocturnal desaturation and he did admit to snoring. He was advised to contact PCP to discuss arranging sleep study. - He works a high stress job and has a lot of stress in his life. He reports a good support system but was advised to explore different ways of coping with his stress. Dr. Graciela Husbands has advised to keep out of work for 2 weeks. A work note was given.   Discharge Vitals: Blood pressure 127/83, pulse 64, temperature 97.5 F (36.4 C), temperature source Oral, resp. rate 20, height 5\' 9"  (1.753 m), weight 190 lb 0.6 oz (86.2 kg), SpO2 96.00%.  Labs: Lab Results  Component Value Date   WBC 12.1* 03/29/2013   HGB 11.6* 03/29/2013   HCT 34.4* 03/29/2013   MCV 91.5 03/29/2013   PLT 238 03/29/2013    Recent Labs Lab 03/27/13 0546  03/29/13 0735  NA 136  < > 141  K 3.9  < > 3.7  CL 98  < > 102  CO2 27  < > 28  BUN 15  < > 20  CREATININE 1.02  < > 1.00  CALCIUM 10.5  < > 8.4  PROT 7.4  --   --   BILITOT 0.5  --   --   ALKPHOS 102  --   --   ALT 23  --   --   AST 30  --   --   GLUCOSE 154*  < > 87  < > = values in this interval not displayed.  Recent Labs  03/27/13 0546 03/27/13 1313 03/27/13 2225 03/28/13 0205  TROPONINI 0.75* 3.75* 2.84* 4.31*   Lab Results  Component Value Date   CHOL 146 03/28/2013   HDL 38* 03/28/2013   LDLCALC 80 03/28/2013   TRIG 138 03/28/2013     Diagnostic Studies/Procedures   Cardiac  catheterization this admission, please see full report and above for summary.  Dg Chest Portable 1 View  03/27/2013   CLINICAL DATA:  Chest pain and dyspnea with history of hypertension  EXAM: PORTABLE CHEST - 1 VIEW  COMPARISON:  None.  FINDINGS: The lungs are mildly hypoinflated. There is no focal infiltrate. The cardiopericardial silhouette is normal in size. The mediastinum is normal in width. There is tortuosity of the ascending and descending thoracic aorta. There is no pleural effusion. The mediastinum is normal in width. There is no pleural effusion or pneumothorax. The observed portions of the bony thorax appear normal.  IMPRESSION: There is mild bilateral pulmonary hypo inflation. There is no evidence of pneumonia nor CHF. When the patient can tolerate her procedure, a PA and lateral chest x-ray with deep inspiration would be useful if chest discomfort process.   Electronically Signed   By: David  [place]   On: 03/27/2013 07:07    Discharge Medications     Medication List         aspirin EC 81 MG tablet  Take 81 mg by mouth daily.     atorvastatin 80 MG tablet  Commonly known as:  LIPITOR  Take 1 tablet (80 mg total) by mouth every evening.     clopidogrel 75 MG tablet  Commonly known as:  PLAVIX  Take 1 tablet (75 mg total) by mouth daily.     isosorbide mononitrate 30 MG 24 hr tablet  Commonly known as:  IMDUR  Take 1 tablet (30 mg total) by mouth daily.     lisinopril 20 MG tablet  Commonly known as:  PRINIVIL,ZESTRIL  Take 20 mg by mouth daily.     multivitamin with minerals Tabs tablet  Take 1 tablet by mouth daily.     nitroGLYCERIN 0.4 MG SL tablet  Commonly known as:  NITROSTAT  Place 1 tablet (0.4 mg total) under the tongue every 5 (five) minutes as needed for chest pain (up to 3 doses).        Disposition   The patient will be discharged in stable condition to home. Discharge Orders   Future Appointments Provider Department Dept Phone   04/06/2013 2:30  PM Minda Meo, PA-C [facility] [place] Office [phone]   Future Orders Complete By Expires   Amb Referral to Cardiac Rehabilitation  As directed    Diet - low sodium heart healthy  As directed    Increase activity slowly  As directed    Comments:     No driving for 1 week. No lifting over 10 lbs for 2 weeks. No sexual activity for 2 weeks. You may return to work on 04/12/13. Keep procedure  site clean & dry. If you notice increased pain, swelling, bleeding or pus, call/return!  You may shower, but no soaking baths/hot tubs/pools for 1 week.     Follow-up Information   Follow up with EDMISTEN, BROOKE, PA-C. [facility] - PA for Dr. [place] - 04/06/13 at 2:30pm)    Specialty:  Cardiology   Contact information:   [address] [place] [address] [phone]       Follow up with Primary Care Provider. (Please follow-up with your PCP to discuss 1) arranging a sleep study to evaluate for sleep apnea and 2) rechecking your white blood cell count in 1-2 weeks to make sure it comes back to normal.)         Duration of Discharge Encounter: Greater than 30 minutes including physician and PA time.  Signed, Ronie Spies PA-C 03/29/2013, 11:47 AM

## 2013-03-29 NOTE — Progress Notes (Signed)
CARDIAC REHAB PHASE I   PRE:  Rate/Rhythm: 56 SB    BP: sitting 119/83    SaO2:   MODE:  Ambulation: 1000 ft   POST:  Rate/Rhythm: 71 SR    BP: sitting 143/81     SaO2:   Tolerated well. Denied CP. Some weak legs from bedrest. Ed completed with good reception from pt and wife. Interested in [facility] and will send referral to [place] [facility]. [phone]   Harriet Masson CES, ACSM 03/29/2013 8:58 AM

## 2013-03-29 NOTE — Progress Notes (Signed)
Patient: Christian Ball / Admit Date: 03/27/2013 / Date of Encounter: 03/29/2013, 6:37 AM   Subjective  Feels well, no CP or SOB.   Objective   Telemetry: NSR. Nocturnal desats noted.  Physical Exam: Blood pressure 96/50, pulse 67, temperature 97.5 F (36.4 C), temperature source Oral, resp. rate 16, height 5\' 9"  (1.753 m), weight 190 lb 0.6 oz (86.2 kg), SpO2 94.00%. General: Well developed, well nourished, in no acute distress. Head: Normocephalic, atraumatic, sclera non-icteric, no xanthomas, nares are without discharge. Neck: Negative for carotid bruits. JVP not elevated. Lungs: Clear bilaterally to auscultation without wheezes, rales, or rhonchi. Breathing is unlabored. Heart: RRR S1 S2 without murmurs, rubs, or gallops.  Abdomen: Soft, non-tender, non-distended with normoactive bowel sounds. No rebound/guarding. Extremities: No clubbing or cyanosis. No edema. Distal pedal pulses are 2+ and equal bilaterally. R radial site without complication Neuro: Alert and oriented X 3. Moves all extremities spontaneously. Psych:  Responds to questions appropriately with a normal affect.   Intake/Output Summary (Last 24 hours) at 03/29/13 0637 Last data filed at 03/28/13 1839  Gross per 24 hour  Intake 1115.4 ml  Output    475 ml  Net  640.4 ml    Inpatient Medications:  . aspirin EC  81 mg Oral Daily  . atorvastatin  80 mg Oral q1800  . clopidogrel  75 mg Oral Q breakfast  . isosorbide mononitrate  30 mg Oral Daily  . lisinopril  20 mg Oral Daily  . metoprolol tartrate  25 mg Oral BID  . multivitamin with minerals  1 tablet Oral Daily   Infusions:  . sodium chloride Stopped (03/28/13 0400)    Labs:  Recent Labs  03/27/13 0546 03/28/13 0407  NA 136 142  K 3.9 3.4*  CL 98 103  CO2 27 27  GLUCOSE 154* 93  BUN 15 13  CREATININE 1.02 1.05  CALCIUM 10.5 8.8    Recent Labs  03/27/13 0546  AST 30  ALT 23  ALKPHOS 102  BILITOT 0.5  PROT 7.4  ALBUMIN 3.7     Recent Labs  03/27/13 0546 03/28/13 0407 03/29/13 0610  WBC 11.6* 11.5* 12.1*  NEUTROABS 8.3*  --   --   HGB 14.8 13.1 11.6*  HCT 41.4 37.7* 34.4*  MCV 87.2 90.2 91.5  PLT 280 247 238    Recent Labs  03/27/13 0546 03/27/13 1313 03/27/13 2225 03/28/13 0205  TROPONINI 0.75* 3.75* 2.84* 4.31*    Recent Labs  03/27/13 1313  HGBA1C 5.6     Radiology/Studies:  Dg Chest Portable 1 View  03/27/2013   CLINICAL DATA:  Chest pain and dyspnea with history of hypertension  EXAM: PORTABLE CHEST - 1 VIEW  COMPARISON:  None.  FINDINGS: The lungs are mildly hypoinflated. There is no focal infiltrate. The cardiopericardial silhouette is normal in size. The mediastinum is normal in width. There is tortuosity of the ascending and descending thoracic aorta. There is no pleural effusion. The mediastinum is normal in width. There is no pleural effusion or pneumothorax. The observed portions of the bony thorax appear normal.  IMPRESSION: There is mild bilateral pulmonary hypo inflation. There is no evidence of pneumonia nor CHF. When the patient can tolerate her procedure, a PA and lateral chest x-ray with deep inspiration would be useful if chest discomfort process.   Electronically Signed   By: David  [place]   On: 03/27/2013 07:07     Assessment and Plan  1. NSTEMI/newly diagnosed CAD: 2nd PLOM branch  with L-R collaterals, for medical rx. Consider PCI if refractory sx. Continue ASA, Plavix, statin, metoprolol, nitrates. 2. HTN: variable BP. Recheck this AM. Consider dropping lisinopril dose given new addition of BB/Imdur. 3. HL: statin added this adm given NSTEMI. LDL 80. Recheck lipids/LFTs 6 weeks as outpatient. 4. Leukocytosis: unclear etiology, may be reactive to MI. No focal symptoms including cough, dysuria, fever. Can consider repeat as outpatient. 5. Nocturnal desat: pt does report h/o snoring. I advised f/u PCP to discuss possible sleep study to r/o sleep apnea. 6. Hypokalemia: f/u  BMET this AM. 7. ICM EF 45-50% by echo, 55-65% by cath: regimen as above.  F/u BMET, BP this AM. Ambulate with cardiac rehab. If he does well, likely DC. 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  Signed, Ronie Spies PA-C  Anticipate discharge this afternoon. We'll keep from work for 2 weeks. Have discussed life/work decision making as relates to stress.  We'll discharge on statins, aspirin, Plavix and nitrates. We'll discontinue beta blockers given modest hypotension We'll arrange followup with Dr. Garth Bigness. Need outpatient sleep study

## 2013-04-06 ENCOUNTER — Encounter: Payer: Self-pay | Admitting: Cardiology

## 2013-04-06 ENCOUNTER — Ambulatory Visit (INDEPENDENT_AMBULATORY_CARE_PROVIDER_SITE_OTHER): Payer: Managed Care, Other (non HMO) | Admitting: Cardiology

## 2013-04-06 VITALS — BP 110/68 | HR 107 | Ht 69.0 in | Wt 189.1 lb

## 2013-04-06 DIAGNOSIS — Z733 Stress, not elsewhere classified: Secondary | ICD-10-CM

## 2013-04-06 DIAGNOSIS — I214 Non-ST elevation (NSTEMI) myocardial infarction: Secondary | ICD-10-CM

## 2013-04-06 DIAGNOSIS — F439 Reaction to severe stress, unspecified: Secondary | ICD-10-CM

## 2013-04-06 DIAGNOSIS — I2510 Atherosclerotic heart disease of native coronary artery without angina pectoris: Secondary | ICD-10-CM

## 2013-04-06 DIAGNOSIS — I10 Essential (primary) hypertension: Secondary | ICD-10-CM

## 2013-04-06 DIAGNOSIS — E785 Hyperlipidemia, unspecified: Secondary | ICD-10-CM

## 2013-04-06 NOTE — Progress Notes (Signed)
Patient ID: Christian Ball MRN: [id number], DOB/AGE: March 29, 1951   Date of Visit: 04/06/2013  Primary Physician: No primary provider on file. Primary Cardiologist: Martinique, MD Reason for Visit: Hospital follow-up  History of Present Illness  Christian Ball is a 63 y.o. male with HTN and dyslipidemia who was admitted 12/28 - 12/30 with CP and ruled in for NSTEMI. He was started on beta blocker, statin and IV heparin. He underwent cardiac cath showing single vessel obstructive CAD involving the second PLOM branch of the RCA, with L-R collaterals. Residual 20-30% mid LAD disease, otherwise normal LCx. Medical therapy was recommended, reserving PCI for refractory symptoms. He was placed on Plavix and Imdur. Peak troponin 4.31. EF was 55-65%. 2D echo was obtained showing basal inferior and inferolateral hypokinesis and mid inferior wall hypokinesis, EF 45-50%, mild MR. Christian Ball developed hypotension and BB was stopped.  Today, he presents with his wife for hospital follow-up. He reports he is doing well and has no complaints. Upon further questioning, he describes "fleeting burning" chest discomfort which he noticed on 2 occasions while walking around [place] store. He states it only lasted 1-2 seconds and he continued to walk and it subsided without recurrence. He states he felt different from his previous angina. He continues to walk daily at [place] or [place] for exercise and states he is pleased with his progress. He would like to start Cardiac Rehab. He had no other symptoms. He denies recurrent CP similar to what he experienced prior to admission. He denies shortness of breath. He denies palpitations, dizziness, near syncope or syncope. He denies LE swelling, orthopnea, PND or recent weight gain. He is compliant and tolerating medications without difficulty. He is checking his BP daily at home and notes his readings are consistently 90/60.   Past Medical History Past Medical History    Diagnosis Date  . Hypertension   . Dyslipidemia   . CAD (coronary artery disease)     a. 02/2013: NSTEMI -> cath 2nd PLOM branch with L-R collaterals, for medical rx. Consider PCI if refractory sx.  . Leukocytosis     a. During 03/2103 adm. May need OP recheck.  . Nocturnal oxygen desaturation     a. Instructed to f/u PCP for possible sleep study.  . Ischemic cardiomyopathy     a. 02/2013 adm - EF 45-50% by echo, 55-65% by cath.  . Situational stress     Past Surgical History None  Allergies/Intolerances Allergies  Allergen Reactions  . Codeine Rash  . Penicillins Rash  . Sulfa Antibiotics Rash    Current Home Medications Current Outpatient Prescriptions  Medication Sig Dispense Refill  . aspirin EC 81 MG tablet Take 81 mg by mouth daily.      Marland Kitchen atorvastatin (LIPITOR) 80 MG tablet Take 1 tablet (80 mg total) by mouth every evening.  30 tablet  6  . clopidogrel (PLAVIX) 75 MG tablet Take 1 tablet (75 mg total) by mouth daily.  30 tablet  6  . isosorbide mononitrate (IMDUR) 30 MG 24 hr tablet Take 1 tablet (30 mg total) by mouth daily.  30 tablet  6  . lisinopril (PRINIVIL,ZESTRIL) 20 MG tablet Take 20 mg by mouth daily.      . Multiple Vitamin (MULTIVITAMIN WITH MINERALS) TABS tablet Take 1 tablet by mouth daily.      . nitroGLYCERIN (NITROSTAT) 0.4 MG SL tablet Place 1 tablet (0.4 mg total) under the tongue every 5 (five) minutes as needed for chest  pain (up to 3 doses).  25 tablet  3   No current facility-administered medications for this visit.    Social History History   Social History  . Marital Status: Married    Spouse Name: N/A    Number of Children: N/A  . Years of Education: N/A   Occupational History  . Not on file.   Social History Main Topics  . Smoking status: Never Smoker   . Smokeless tobacco: Not on file     Comment: has had occasional cigar.  . Alcohol Use: Yes     Comment: 2 drinks/year  . Drug Use: No  . Sexual Activity: Yes   Other  Topics Concern  . Not on file   Social History Narrative   Lives in [place] with his wife.  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  Not currently exercising on routine basis.     Review of Systems General: No chills, fever, night sweats or weight changes Cardiovascular: No chest pain, dyspnea on exertion, edema, orthopnea, palpitations, paroxysmal nocturnal dyspnea Dermatological: No rash, lesions or masses Respiratory: No cough, dyspnea Urologic: No hematuria, dysuria Abdominal: No nausea, vomiting, diarrhea, bright red blood per rectum, melena, or hematemesis Neurologic: No visual changes, weakness, changes in mental status All other systems reviewed and are otherwise negative except as noted above.  Physical Exam Vitals: Blood pressure 110/68, pulse 107, height 5\' 9"  (1.753 m), weight 189 lb 1.9 oz (85.784 kg), SpO2 95.00%.  General: Well developed, well appearing 63 y.o. male in no acute distress. HEENT: Normocephalic, atraumatic. EOMs intact. Sclera nonicteric. Oropharynx clear.  Neck: Supple without bruits. No JVD. Lungs: Respirations regular and unlabored, CTA bilaterally. No wheezes, rales or rhonchi. Heart: RRR. S1, S2 present. No murmurs, rub, S3 or S4. Abdomen: Soft, non-distended.  Extremities: No clubbing, cyanosis or edema. PT/Radials 2+ and equal bilaterally. Psych: Normal affect. Neuro: Alert and oriented X 3. Moves all extremities spontaneously.   Diagnostics 12-lead ECG today - NSR at 100 bpm; inferior Q waves; no ST-T wave abnormalities; PR 166, QRS 88, QT/QTc 344/443   Assessment and Plan 1. Newly diagnosed CAD s/p recent NSTEMI - 2nd PLOM branch with L-R collaterals, medical management recommended; consider PCI if refractory angina  - continue medical therapy; BP at home consistently 90/60 so unable to add BB or up-titrate meds at this time - begin cardiac rehab - return to see Dr. Martinique in 4 weeks 2. LV dysfunction, EF 45-50% by echo but 55-65% by cath  - stable;  no signs of volume overload - consider repeat echo in 6 weeks 3. HTN - BP running low normal - continue current regimen - as above, unable to titrate meds at this time 4. Dyslipidemia - continue statin - check fasting lipid panel in 6 weeks 5. Situational stress - counseled regarding the importance of stress management and lifestyle modifications - he has requested assistance at work and is planning to adjust his hours and "accessibility to clients"  Signed, Ileene Hutchinson, PA-C 04/06/2013, 5:23 PM

## 2013-04-06 NOTE — Patient Instructions (Signed)
Your physician recommends that you schedule a follow-up appointment in: WITH DR. Martinique IN 4 WEEKS  Your physician recommends that you continue on your current medications as directed. Please refer to the Current Medication list given to you today.

## 2013-04-08 ENCOUNTER — Telehealth: Payer: Self-pay | Admitting: Cardiology

## 2013-04-08 NOTE — Telephone Encounter (Signed)
Discussed Mr. Christian Ball progress with Dr. Martinique. Please see my recent office note from 04/06/2013. Dr. Martinique agrees that Mr. Christian Ball is stable and can start cardiac rehab. He should keep his follow-up with Dr. Martinique in 4 weeks.   I called to inform patient but no answer. I left a message with the above information and requested a call back to confirm receipt.

## 2013-05-16 ENCOUNTER — Ambulatory Visit: Payer: Managed Care, Other (non HMO) | Admitting: Cardiology

## 2013-05-20 ENCOUNTER — Encounter: Payer: Self-pay | Admitting: Cardiology

## 2013-06-06 ENCOUNTER — Encounter: Payer: Self-pay | Admitting: Cardiology

## 2013-06-29 ENCOUNTER — Ambulatory Visit: Payer: Managed Care, Other (non HMO) | Admitting: Cardiology

## 2013-06-30 ENCOUNTER — Encounter: Payer: Self-pay | Admitting: Cardiology

## 2013-06-30 ENCOUNTER — Ambulatory Visit (INDEPENDENT_AMBULATORY_CARE_PROVIDER_SITE_OTHER): Payer: Managed Care, Other (non HMO) | Admitting: Cardiology

## 2013-06-30 VITALS — BP 122/64 | HR 93 | Ht 69.0 in | Wt 187.8 lb

## 2013-06-30 DIAGNOSIS — I214 Non-ST elevation (NSTEMI) myocardial infarction: Secondary | ICD-10-CM

## 2013-06-30 DIAGNOSIS — E785 Hyperlipidemia, unspecified: Secondary | ICD-10-CM | POA: Insufficient documentation

## 2013-06-30 DIAGNOSIS — I10 Essential (primary) hypertension: Secondary | ICD-10-CM

## 2013-06-30 DIAGNOSIS — I2510 Atherosclerotic heart disease of native coronary artery without angina pectoris: Secondary | ICD-10-CM

## 2013-06-30 NOTE — Patient Instructions (Signed)
Continue your current therapy  I will see you in 6 months.   

## 2013-07-01 NOTE — Progress Notes (Signed)
Patient ID: Christian Ball MRN: [id number], DOB/AGE: [birth year]   Date of Visit: 07/01/2013  Primary Physician: No primary provider on file. Primary Cardiologist:  Christian Martinique, MD   History of Present Illness  Christian Ball is a 63 y.o. male with HTN and dyslipidemia who was admitted 12/28 - 12/30 with CP and ruled in for NSTEMI. He underwent cardiac cath showing single vessel obstructive CAD involving the second PLOM branch of the RCA, with L-R collaterals. Residual 20-30% mid LAD disease, otherwise normal LCx. Medical therapy was recommended, reserving PCI for refractory symptoms. He was placed on Plavix and Imdur. Peak troponin 4.31. EF was 55-65%. 2D echo was obtained showing basal inferior and inferolateral hypokinesis and mid inferior wall hypokinesis, EF 45-50%, mild MR. Christian Ball developed hypotension and BB was stopped.  On follow up today he reports he is doing well. Denies any chest pain. He did develop some joint pain that he thought was related to statin therapy but this has resolved since he has been going to the gym. He is exercising regularly. Trying to eat healthy. Tolerating meds well.  Past Medical History Past Medical History  Diagnosis Date  . Hypertension   . Dyslipidemia   . CAD (coronary artery disease)     a. 02/2013: NSTEMI -> cath 2nd PLOM branch with L-R collaterals, for medical rx. Consider PCI if refractory sx.  . Leukocytosis     a. During 03/2103 adm. May need OP recheck.  . Nocturnal oxygen desaturation     a. Instructed to f/u PCP for possible sleep study.  . Ischemic cardiomyopathy     a. 02/2013 adm - EF 45-50% by echo, 55-65% by cath.  . Situational stress     Past Surgical History None  Allergies/Intolerances Allergies  Allergen Reactions  . Codeine Rash  . Penicillins Rash  . Sulfa Antibiotics Rash    Current Home Medications Current Outpatient Prescriptions  Medication Sig Dispense Refill  . aspirin EC 81 MG tablet Take 81 mg by  mouth daily.      Marland Kitchen atorvastatin (LIPITOR) 80 MG tablet Take 1 tablet (80 mg total) by mouth every evening.  30 tablet  6  . clopidogrel (PLAVIX) 75 MG tablet Take 1 tablet (75 mg total) by mouth daily.  30 tablet  6  . isosorbide mononitrate (IMDUR) 30 MG 24 hr tablet Take 1 tablet (30 mg total) by mouth daily.  30 tablet  6  . lisinopril (PRINIVIL,ZESTRIL) 20 MG tablet Take 20 mg by mouth daily.      . Multiple Vitamin (MULTIVITAMIN WITH MINERALS) TABS tablet Take 1 tablet by mouth daily.      . nitroGLYCERIN (NITROSTAT) 0.4 MG SL tablet Place 1 tablet (0.4 mg total) under the tongue every 5 (five) minutes as needed for chest pain (up to 3 doses).  25 tablet  3   No current facility-administered medications for this visit.    Social History History   Social History  . Marital Status: Married    Spouse Name: N/A    Number of Children: N/A  . Years of Education: N/A   Occupational History  . Not on file.   Social History Main Topics  . Smoking status: Never Smoker   . Smokeless tobacco: Not on file     Comment: has had occasional cigar.  . Alcohol Use: Yes     Comment: 2 drinks/year  . Drug Use: No  . Sexual Activity: Yes   Other Topics Concern  .  Not on file   Social History Narrative   Lives in [place] with his wife.  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  Not currently exercising on routine basis.     Review of Systems As noted in HPI. All other systems reviewed and are otherwise negative except as noted above.  Physical Exam Vitals: Blood pressure 122/64, pulse 93, height 5\' 9"  (1.753 m), weight 187 lb 12.8 oz (85.186 kg), SpO2 98.00%.  General: Well developed, well appearing 63 y.o. male in no acute distress. HEENT: Normocephalic, atraumatic. EOMs intact. Sclera nonicteric. Oropharynx clear.  Neck: Supple without bruits. No JVD. Lungs: Respirations regular and unlabored, CTA bilaterally. No wheezes, rales or rhonchi. Heart: RRR. S1, S2 present. No murmurs, rub, S3 or  S4. Abdomen: Soft, non-distended.  Extremities: No clubbing, cyanosis or edema. PT/Radials 2+ and equal bilaterally. Psych: Normal affect. Neuro: Alert and oriented X 3. Moves all extremities spontaneously.   Diagnostics Reviewed labs from 06/06/13: Normal CBC, complete chemistry panel, and TSH. Dated 2.20/15: Cholesterol-104, trig-94, HDL-42, LDL 43.   Assessment and Plan 1.  CAD s/p  NSTEMI, currently asymptomatic. - 2nd PLOM branch 90% with L-R collaterals, medical management recommended; consider PCI if refractory angina but vessel caliber is small.  - continue medical therapy -Continue dietary and exercise modifications. 2. LV dysfunction, EF 45-50% by echo but 55-65% by cath  - stable; no signs of volume overload 3. HTN - BP looks good today. - continue current regimen 4. Dyslipidemia - continue statin  I will follow up in 6 months.

## 2013-07-19 ENCOUNTER — Other Ambulatory Visit: Payer: Self-pay | Admitting: *Deleted

## 2013-07-19 MED ORDER — ATORVASTATIN CALCIUM 80 MG PO TABS: 80.0000 mg | ORAL_TABLET | Freq: Every evening | ORAL | Status: DC

## 2013-07-19 MED ORDER — ISOSORBIDE MONONITRATE ER 30 MG PO TB24: 30.0000 mg | ORAL_TABLET | Freq: Every day | ORAL | Status: DC

## 2013-07-19 MED ORDER — CLOPIDOGREL BISULFATE 75 MG PO TABS: 75.0000 mg | ORAL_TABLET | Freq: Every day | ORAL | Status: DC

## 2013-08-12 ENCOUNTER — Telehealth: Payer: Self-pay | Admitting: Cardiology

## 2013-08-12 NOTE — Telephone Encounter (Signed)
Returned call to patient's wife.She stated husband having pain and stiffness in joints since he started on lipitor.Stated they have twin daughters getting married 6 wks apart and he would like to start feeling better.Advised to hold lipitor this weekend.Will check with Dr.Jordan on Monday 08/15/13 and call back.

## 2013-08-12 NOTE — Telephone Encounter (Signed)
New message     Talk to the nurse about medication side effects

## 2013-08-15 MED ORDER — ATORVASTATIN CALCIUM 20 MG PO TABS: 20.0000 mg | ORAL_TABLET | Freq: Every day | ORAL | Status: DC

## 2013-08-15 NOTE — Telephone Encounter (Signed)
Returned call to patient no answer.LMTC. 

## 2013-08-15 NOTE — Telephone Encounter (Signed)
Received call from patient's wife spoke to Dr.Jordan he advised to hold lipitor for 1 week and then start on lipitor 20 mg daily.Advised to call back if needed.

## 2013-09-22 ENCOUNTER — Telehealth: Payer: Self-pay | Admitting: Cardiology

## 2013-09-22 NOTE — Telephone Encounter (Signed)
Spoke with wife she stated husband saw PCP Dr.Swayne this morning.Stated he was prescribed lexapro for depression.Stated she was told to call Dr.Jordan to make sure ok to take.Message sent to [place] for advice.

## 2013-09-22 NOTE — Telephone Encounter (Signed)
New problem:    Pt's wife called with a question about pt's medication Plavix?  Please give her a call pt saw his PCP today.

## 2013-09-22 NOTE — Telephone Encounter (Signed)
Returned call to patient's wife no answer.LMTC. 

## 2013-09-22 NOTE — Telephone Encounter (Signed)
Follow Up  Wife returned call

## 2013-09-22 NOTE — Telephone Encounter (Signed)
OK to take Lexapro from my standpoint.  Peter Martinique MD, [facility]

## 2013-09-22 NOTE — Telephone Encounter (Signed)
Returned call to patient's wife.No answer.Left message on personal voice mail Dr.Jordan advised ok to take lexapro.

## 2014-01-23 ENCOUNTER — Encounter: Payer: Self-pay | Admitting: Cardiology

## 2014-01-23 ENCOUNTER — Ambulatory Visit (INDEPENDENT_AMBULATORY_CARE_PROVIDER_SITE_OTHER): Payer: Managed Care, Other (non HMO) | Admitting: Cardiology

## 2014-01-23 VITALS — BP 120/80 | HR 82 | Ht 69.0 in | Wt 188.0 lb

## 2014-01-23 DIAGNOSIS — I10 Essential (primary) hypertension: Secondary | ICD-10-CM

## 2014-01-23 DIAGNOSIS — E785 Hyperlipidemia, unspecified: Secondary | ICD-10-CM

## 2014-01-23 DIAGNOSIS — I25118 Atherosclerotic heart disease of native coronary artery with other forms of angina pectoris: Secondary | ICD-10-CM

## 2014-01-23 NOTE — Patient Instructions (Signed)
At the end of December you can stop Plavix.   We will check fasting lipids.  Continue your other medication.  Continue with a heart healthy diet and regular exercise.

## 2014-01-23 NOTE — Progress Notes (Signed)
Patient ID: Christian Ball MRN: [id number], DOB/AGE: [birth year]   Date of Visit: 01/23/2014  Primary Physician: Gara Kroner, MD Primary Cardiologist:  Peter Martinique, MD   History of Present Illness  Christian Ball is a 63 y.o. male with HTN and dyslipidemia who was admitted 12/28 - 12/30 with CP and ruled in for NSTEMI. He underwent cardiac cath showing single vessel obstructive CAD involving the second PLOM branch of the RCA, with L-R collaterals. Residual 20-30% mid LAD disease, otherwise normal LCx. Medical therapy was recommended, reserving PCI for refractory symptoms. He was placed on Plavix and Imdur. Peak troponin 4.31. EF was 55-65%. 2D echo was obtained showing basal inferior and inferolateral hypokinesis and mid inferior wall hypokinesis, EF 45-50%, mild MR. Christian Ball developed hypotension and BB was stopped.  On follow up today he reports he is doing well. Denies any chest pain. 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 He was placed on Lexapro and is doing much better now. He states this has helped him deal with stress at work.  Past Medical History Past Medical History  Diagnosis Date  . Hypertension   . Dyslipidemia   . CAD (coronary artery disease)     a. 02/2013: NSTEMI -> cath 2nd PLOM branch with L-R collaterals, for medical rx. Consider PCI if refractory sx.  . Leukocytosis     a. During 03/2103 adm. May need OP recheck.  . Nocturnal oxygen desaturation     a. Instructed to f/u PCP for possible sleep study.  . Ischemic cardiomyopathy     a. 02/2013 adm - EF 45-50% by echo, 55-65% by cath.  . Situational stress     Past Surgical History None  Allergies/Intolerances Allergies  Allergen Reactions  . Codeine Rash  . Penicillins Rash  . Sulfa Antibiotics Rash    Current Home Medications Current Outpatient Prescriptions  Medication Sig Dispense Refill  . aspirin EC 81 MG tablet Take 81 mg by mouth daily.      Marland Kitchen atorvastatin  (LIPITOR) 20 MG tablet Take 1 tablet (20 mg total) by mouth daily.  30 tablet  6  . clopidogrel (PLAVIX) 75 MG tablet Take 1 tablet (75 mg total) by mouth daily.  30 tablet  5  . escitalopram (LEXAPRO) 5 MG tablet Take 5 mg by mouth daily.      . isosorbide mononitrate (IMDUR) 30 MG 24 hr tablet Take 1 tablet (30 mg total) by mouth daily.  30 tablet  5  . lisinopril (PRINIVIL,ZESTRIL) 20 MG tablet Take 20 mg by mouth daily.      . Multiple Vitamin (MULTIVITAMIN WITH MINERALS) TABS tablet Take 1 tablet by mouth daily.      . nitroGLYCERIN (NITROSTAT) 0.4 MG SL tablet Place 1 tablet (0.4 mg total) under the tongue every 5 (five) minutes as needed for chest pain (up to 3 doses).  25 tablet  3   No current facility-administered medications for this visit.    Social History History   Social History  . Marital Status: Married    Spouse Name: N/A    Number of Children: N/A  . Years of Education: N/A   Occupational History  . Not on file.   Social History Main Topics  . Smoking status: Never Smoker   . Smokeless tobacco: Not on file     Comment: has had occasional cigar.  . Alcohol Use: Yes     Comment: 2 drinks/year  . Drug Use: No  .  Sexual Activity: Yes   Other Topics Concern  . Not on file   Social History Narrative   Lives in [place] with his wife.  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  Not currently exercising on routine basis.     Review of Systems As noted in HPI. All other systems reviewed and are otherwise negative except as noted above.  Physical Exam Vitals: Blood pressure 120/80, pulse 82, height 5\' 9"  (1.753 m), weight 188 lb (85.276 kg).  General: Well developed, well appearing 63 y.o. male in no acute distress. HEENT: Normocephalic, atraumatic. EOMs intact. Sclera nonicteric. Oropharynx clear.  Neck: Supple without bruits. No JVD. Lungs: Respirations regular and unlabored, CTA bilaterally. No wheezes, rales or rhonchi. Heart: RRR. S1, S2 present. No murmurs, rub, S3  or S4. Abdomen: Soft, non-distended.  Extremities: No clubbing, cyanosis or edema. PT/Radials 2+ and equal bilaterally. Psych: Normal affect. Neuro: Alert and oriented X 3. Moves all extremities spontaneously.   Diagnostics   Assessment and Plan 1.  CAD s/p  NSTEMI 12/15, currently asymptomatic. - 2nd PLOM branch 90% with L-R collaterals, medical management recommended - continue medical therapy- he may stop Plavix in December. -Continue dietary and exercise modifications. 2. LV dysfunction, EF 45-50% by echo but 55-65% by cath  - stable; no signs of volume overload 3. HTN - BP looks good today. - continue current regimen 4. Dyslipidemia - continue statin  -check fasting lipids and HFP  I will follow up in 6 months.

## 2014-02-08 ENCOUNTER — Encounter (HOSPITAL_BASED_OUTPATIENT_CLINIC_OR_DEPARTMENT_OTHER)
Admission: RE | Admit: 2014-02-08 | Discharge: 2014-02-08 | Disposition: A | Discharge status: Home / Self Care | Payer: Managed Care, Other (non HMO) | Source: Ambulatory Visit | Attending: Otolaryngology | Admitting: Otolaryngology

## 2014-02-08 ENCOUNTER — Encounter (HOSPITAL_BASED_OUTPATIENT_CLINIC_OR_DEPARTMENT_OTHER): Payer: Self-pay | Admitting: *Deleted

## 2014-02-08 DIAGNOSIS — Z01812 Encounter for preprocedural laboratory examination: Secondary | ICD-10-CM | POA: Insufficient documentation

## 2014-02-08 LAB — BASIC METABOLIC PANEL
Anion gap: 13 (ref 5–15)
BUN: 17 mg/dL (ref 6–23)
CO2: 30 meq/L (ref 19–32)
Calcium: 9.5 mg/dL (ref 8.4–10.5)
Chloride: 95 mEq/L — ABNORMAL LOW (ref 96–112)
Creatinine, Ser: 1.08 mg/dL (ref 0.50–1.35)
GFR calc Af Amer: 82 mL/min — ABNORMAL LOW (ref >90)
GFR calc non Af Amer: 71 mL/min — ABNORMAL LOW (ref >90)
GLUCOSE: 84 mg/dL (ref 70–99)
Potassium: 3.6 mEq/L — ABNORMAL LOW (ref 3.7–5.3)
Sodium: 138 mEq/L (ref 137–147)

## 2014-02-08 NOTE — Progress Notes (Signed)
   02/08/14 1456  OBSTRUCTIVE SLEEP APNEA  Have you ever been diagnosed with sleep apnea through a sleep study? No  Do you snore loudly (loud enough to be heard through closed doors)?  1  Do you often feel tired, fatigued, or sleepy during the daytime? 0  Has anyone observed you stop breathing during your sleep? 0  Do you have, or are you being treated for high blood pressure? 1  BMI more than 35 kg/m2? 0  Age over 64 years old? 1  Neck circumference greater than 40 cm/16 inches? 0  Gender: 1  Obstructive Sleep Apnea Score 4  Score 4 or greater  Results sent to PCP

## 2014-02-08 NOTE — Progress Notes (Signed)
Sees dr Buddy Duty 01/23/14-cath 12/14-no stents Will need bmet

## 2014-02-10 ENCOUNTER — Other Ambulatory Visit: Payer: Self-pay | Admitting: Otolaryngology

## 2014-02-10 NOTE — H&P (Signed)
PREOPERATIVE H&P  Chief Complaint: right buccal mucosa scca  HPI: Christian Ball is a 63 y.o. male who presents for evaluation of right buccal mucosa scca which was biopsied by Dr Romie Minus measuring about 1 cm in size and measured 3.5 mm in thickness. He does not use tobacco products. He has cardiac history followed by Dr Martinique and on plavix. He's taken to the OR for wide re excision of the buccal mucosa.  Past Medical History  Diagnosis Date  . Hypertension   . Dyslipidemia   . CAD (coronary artery disease)     a. 02/2013: NSTEMI -> cath 2nd PLOM branch with L-R collaterals, for medical rx. Consider PCI if refractory sx.  . Leukocytosis     a. During 03/2103 adm. May need OP recheck.  . Nocturnal oxygen desaturation     a. Instructed to f/u PCP for possible sleep study.  . Ischemic cardiomyopathy     a. 02/2013 adm - EF 45-50% by echo, 55-65% by cath.  . Situational stress    Past Surgical History  Procedure Laterality Date  . Cardiac catheterization  12/14  . Colonoscopy     History   Social History  . Marital Status: Married    Spouse Name: N/A    Number of Children: N/A  . Years of Education: N/A   Social History Main Topics  . Smoking status: Never Smoker   . Smokeless tobacco: None     Comment: has had occasional cigar.  . Alcohol Use: Yes     Comment: 2 drinks/year  . Drug Use: No  . Sexual Activity: Yes   Other Topics Concern  . None   Social History Narrative   Lives in [place] with his wife.  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  Not currently exercising on routine basis.   Family History  Problem Relation Age of Onset  . Cancer Mother     died at 75  . GI Bleed Father     died at 77   Allergies  Allergen Reactions  . Codeine Rash  . Penicillins Rash  . Sulfa Antibiotics Rash   Prior to Admission medications   Medication Sig Start Date End Date Taking? Authorizing Provider  aspirin EC 81 MG tablet Take 81 mg by mouth daily.    Historical Provider, MD   atorvastatin (LIPITOR) 20 MG tablet Take 1 tablet (20 mg total) by mouth daily. 08/15/13   Peter M Martinique, MD  clopidogrel (PLAVIX) 75 MG tablet Take 1 tablet (75 mg total) by mouth daily. 07/19/13   Peter M Martinique, MD  escitalopram (LEXAPRO) 5 MG tablet Take 10 mg by mouth daily.     Historical Provider, MD  isosorbide mononitrate (IMDUR) 30 MG 24 hr tablet Take 1 tablet (30 mg total) by mouth daily. 07/19/13   Peter M Martinique, MD  lisinopril (PRINIVIL,ZESTRIL) 20 MG tablet Take 20 mg by mouth daily.    Historical Provider, MD  Multiple Vitamin (MULTIVITAMIN WITH MINERALS) TABS tablet Take 1 tablet by mouth daily.    Historical Provider, MD  nitroGLYCERIN (NITROSTAT) 0.4 MG SL tablet Place 1 tablet (0.4 mg total) under the tongue every 5 (five) minutes as needed for chest pain (up to 3 doses). 03/29/13   Dayna N Dunn, PA-C     Positive ROS: negative  All other systems have been reviewed and were otherwise negative with the exception of those mentioned in the HPI and as above.  Physical Exam: There were no vitals  filed for this visit.  General: Alert, no acute distress Oral: Normal  Tonsils. Sore on right buccal mucosa. Nasal: Clear nasal passages Neck: No palpable adenopathy or thyroid nodules Ear: Ear canal is clear with normal appearing TMs Cardiovascular: Regular rate and rhythm, no murmur.  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

## 2014-02-13 ENCOUNTER — Ambulatory Visit (HOSPITAL_COMMUNITY): Payer: Managed Care, Other (non HMO)

## 2014-02-13 ENCOUNTER — Ambulatory Visit (HOSPITAL_BASED_OUTPATIENT_CLINIC_OR_DEPARTMENT_OTHER)
Admission: RE | Admit: 2014-02-13 | Discharge: 2014-02-13 | Disposition: A | Discharge status: Home / Self Care | Payer: Managed Care, Other (non HMO) | Source: Ambulatory Visit | Attending: Otolaryngology | Admitting: Otolaryngology

## 2014-02-13 ENCOUNTER — Encounter (HOSPITAL_BASED_OUTPATIENT_CLINIC_OR_DEPARTMENT_OTHER)
Admission: RE | Disposition: A | Discharge status: Home / Self Care | Payer: Self-pay | Source: Ambulatory Visit | Attending: Otolaryngology

## 2014-02-13 ENCOUNTER — Ambulatory Visit (HOSPITAL_BASED_OUTPATIENT_CLINIC_OR_DEPARTMENT_OTHER): Payer: Managed Care, Other (non HMO) | Admitting: Anesthesiology

## 2014-02-13 ENCOUNTER — Encounter (HOSPITAL_BASED_OUTPATIENT_CLINIC_OR_DEPARTMENT_OTHER): Payer: Self-pay

## 2014-02-13 DIAGNOSIS — C060 Malignant neoplasm of cheek mucosa: Secondary | ICD-10-CM | POA: Insufficient documentation

## 2014-02-13 DIAGNOSIS — E785 Hyperlipidemia, unspecified: Secondary | ICD-10-CM | POA: Diagnosis not present

## 2014-02-13 DIAGNOSIS — I252 Old myocardial infarction: Secondary | ICD-10-CM | POA: Insufficient documentation

## 2014-02-13 DIAGNOSIS — I2510 Atherosclerotic heart disease of native coronary artery without angina pectoris: Secondary | ICD-10-CM | POA: Diagnosis not present

## 2014-02-13 DIAGNOSIS — I10 Essential (primary) hypertension: Secondary | ICD-10-CM | POA: Diagnosis not present

## 2014-02-13 DIAGNOSIS — R6889 Other general symptoms and signs: Secondary | ICD-10-CM

## 2014-02-13 DIAGNOSIS — K1321 Leukoplakia of oral mucosa, including tongue: Secondary | ICD-10-CM | POA: Diagnosis not present

## 2014-02-13 HISTORY — PX: EXCISION ORAL TUMOR: SHX6265

## 2014-02-13 LAB — POCT HEMOGLOBIN-HEMACUE: HEMOGLOBIN: 14.6 g/dL (ref 13.0–17.0)

## 2014-02-13 SURGERY — EXCISION, NEOPLASM, MOUTH
Anesthesia: General | Site: Mouth | Laterality: Bilateral

## 2014-02-13 MED ORDER — CEFAZOLIN SODIUM-DEXTROSE 2-3 GM-% IV SOLR
2.0000 g | INTRAVENOUS | Status: AC
  Administered 2014-02-13: 2 g via INTRAVENOUS

## 2014-02-13 MED ORDER — PROPOFOL 10 MG/ML IV BOLUS
INTRAVENOUS | Status: AC
  Filled 2014-02-13: qty 20

## 2014-02-13 MED ORDER — GLYCOPYRROLATE 0.2 MG/ML IJ SOLN
INTRAMUSCULAR | Status: DC | PRN
  Administered 2014-02-13: 0.2 mg via INTRAVENOUS

## 2014-02-13 MED ORDER — FENTANYL CITRATE 0.05 MG/ML IJ SOLN
INTRAMUSCULAR | Status: DC | PRN
  Administered 2014-02-13: 100 ug via INTRAVENOUS
  Administered 2014-02-13: 50 ug via INTRAVENOUS

## 2014-02-13 MED ORDER — DEXAMETHASONE SODIUM PHOSPHATE 4 MG/ML IJ SOLN
INTRAMUSCULAR | Status: DC | PRN
  Administered 2014-02-13: 10 mg via INTRAVENOUS

## 2014-02-13 MED ORDER — PROPOFOL 10 MG/ML IV BOLUS
INTRAVENOUS | Status: DC | PRN
  Administered 2014-02-13: 150 mg via INTRAVENOUS
  Administered 2014-02-13: 200 mg via INTRAVENOUS
  Administered 2014-02-13: 100 mg via INTRAVENOUS

## 2014-02-13 MED ORDER — FENTANYL CITRATE 0.05 MG/ML IJ SOLN
INTRAMUSCULAR | Status: AC
  Filled 2014-02-13: qty 6

## 2014-02-13 MED ORDER — LACTATED RINGERS IV SOLN
INTRAVENOUS | Status: DC
  Administered 2014-02-13 (×2): via INTRAVENOUS

## 2014-02-13 MED ORDER — LIDOCAINE-EPINEPHRINE 1 %-1:100000 IJ SOLN
INTRAMUSCULAR | Status: DC | PRN
  Administered 2014-02-13: 2 mL
  Administered 2014-02-13: 8 mL

## 2014-02-13 MED ORDER — LIDOCAINE-EPINEPHRINE 1 %-1:100000 IJ SOLN
INTRAMUSCULAR | Status: AC
  Filled 2014-02-13: qty 1

## 2014-02-13 MED ORDER — ONDANSETRON HCL 4 MG/2ML IJ SOLN: 4.0000 mg | Freq: Once | INTRAMUSCULAR | Status: DC | PRN

## 2014-02-13 MED ORDER — FENTANYL CITRATE 0.05 MG/ML IJ SOLN: 25.0000 ug | INTRAMUSCULAR | Status: DC | PRN

## 2014-02-13 MED ORDER — BACIT-POLY-NEO HC 1 % EX OINT
TOPICAL_OINTMENT | CUTANEOUS | Status: AC
  Filled 2014-02-13: qty 15

## 2014-02-13 MED ORDER — EPHEDRINE SULFATE 50 MG/ML IJ SOLN
INTRAMUSCULAR | Status: DC | PRN
  Administered 2014-02-13 (×3): 10 mg via INTRAVENOUS

## 2014-02-13 MED ORDER — MIDAZOLAM HCL 5 MG/5ML IJ SOLN
INTRAMUSCULAR | Status: DC | PRN
  Administered 2014-02-13: 2 mg via INTRAVENOUS

## 2014-02-13 MED ORDER — LIDOCAINE HCL (CARDIAC) 10 MG/ML IV SOLN
INTRAVENOUS | Status: DC | PRN
  Administered 2014-02-13: 100 mg via INTRAVENOUS

## 2014-02-13 MED ORDER — EPINEPHRINE HCL 0.1 MG/ML IJ SOSY
PREFILLED_SYRINGE | INTRAMUSCULAR | Status: DC | PRN
  Administered 2014-02-13 (×2): 2 mL via INTRAVENOUS

## 2014-02-13 MED ORDER — MIDAZOLAM HCL 2 MG/2ML IJ SOLN
INTRAMUSCULAR | Status: AC
  Filled 2014-02-13: qty 2

## 2014-02-13 MED ORDER — FENTANYL CITRATE 0.05 MG/ML IJ SOLN: 50.0000 ug | INTRAMUSCULAR | Status: DC | PRN

## 2014-02-13 MED ORDER — HYDROCODONE-ACETAMINOPHEN 5-325 MG PO TABS: 1.0000 | ORAL_TABLET | Freq: Four times a day (QID) | ORAL | Status: DC | PRN

## 2014-02-13 MED ORDER — CEPHALEXIN 500 MG PO CAPS: 500.0000 mg | ORAL_CAPSULE | Freq: Two times a day (BID) | ORAL | Status: DC

## 2014-02-13 MED ORDER — DEXTROSE 5 % IV SOLN
20.0000 mg | INTRAVENOUS | Status: DC | PRN
  Administered 2014-02-13: 50 ug/min via INTRAVENOUS

## 2014-02-13 MED ORDER — MIDAZOLAM HCL 2 MG/2ML IJ SOLN: 1.0000 mg | INTRAMUSCULAR | Status: DC | PRN

## 2014-02-13 MED ORDER — PROPOFOL INFUSION 10 MG/ML OPTIME
INTRAVENOUS | Status: DC | PRN
  Administered 2014-02-13: 100 ug/kg/min via INTRAVENOUS

## 2014-02-13 MED ORDER — LIDOCAINE-EPINEPHRINE (PF) 1 %-1:200000 IJ SOLN
INTRAMUSCULAR | Status: AC
  Filled 2014-02-13: qty 10

## 2014-02-13 MED ORDER — CEFAZOLIN SODIUM-DEXTROSE 2-3 GM-% IV SOLR
INTRAVENOUS | Status: AC
  Filled 2014-02-13: qty 50

## 2014-02-13 MED ORDER — ALBUTEROL SULFATE HFA 108 (90 BASE) MCG/ACT IN AERS
INHALATION_SPRAY | RESPIRATORY_TRACT | Status: DC | PRN
  Administered 2014-02-13 (×2): 4 via RESPIRATORY_TRACT

## 2014-02-13 MED ORDER — ACETAMINOPHEN 325 MG PO TABS
ORAL_TABLET | ORAL | Status: AC
  Filled 2014-02-13: qty 3

## 2014-02-13 MED ORDER — ACETAMINOPHEN 325 MG PO TABS
975.0000 mg | ORAL_TABLET | Freq: Once | ORAL | Status: AC | PRN
  Administered 2014-02-13: 975 mg via ORAL

## 2014-02-13 MED ORDER — SUCCINYLCHOLINE CHLORIDE 20 MG/ML IJ SOLN
INTRAMUSCULAR | Status: DC | PRN
  Administered 2014-02-13 (×2): 100 mg via INTRAVENOUS

## 2014-02-13 SURGICAL SUPPLY — 48 items
BLADE SURG 15 STRL LF DISP TIS (BLADE) ×1 IMPLANT
BLADE SURG 15 STRL SS (BLADE) ×2
CANISTER SUCT 1200ML W/VALVE (MISCELLANEOUS) ×1 IMPLANT
CLEANER CAUTERY TIP 5X5 PAD (MISCELLANEOUS) IMPLANT
CORDS BIPOLAR (ELECTRODE) IMPLANT
COVER BACK TABLE 60X90IN (DRAPES) ×2 IMPLANT
COVER MAYO STAND STRL (DRAPES) ×2 IMPLANT
DECANTER SPIKE VIAL GLASS SM (MISCELLANEOUS) IMPLANT
DRAPE U-SHAPE 76X120 STRL (DRAPES) ×2 IMPLANT
ELECT COATED BLADE 2.86 ST (ELECTRODE) ×2 IMPLANT
ELECT REM PT RETURN 9FT ADLT (ELECTROSURGICAL) ×2
ELECTRODE REM PT RTRN 9FT ADLT (ELECTROSURGICAL) ×1 IMPLANT
GAUZE SPONGE 4X4 16PLY XRAY LF (GAUZE/BANDAGES/DRESSINGS) IMPLANT
GLOVE BIOGEL PI IND STRL 7.0 (GLOVE) IMPLANT
GLOVE BIOGEL PI INDICATOR 7.0 (GLOVE) ×1
GLOVE ECLIPSE 6.5 STRL STRAW (GLOVE) ×1 IMPLANT
GLOVE SS BIOGEL STRL SZ 7.5 (GLOVE) ×1 IMPLANT
GLOVE SUPERSENSE BIOGEL SZ 7.5 (GLOVE) ×1
GOWN STRL REUS W/ TWL LRG LVL3 (GOWN DISPOSABLE) ×1 IMPLANT
GOWN STRL REUS W/ TWL XL LVL3 (GOWN DISPOSABLE) IMPLANT
GOWN STRL REUS W/TWL LRG LVL3 (GOWN DISPOSABLE) ×2
GOWN STRL REUS W/TWL XL LVL3 (GOWN DISPOSABLE) ×2
HEMOSTAT SURGICEL .5X2 ABSORB (HEMOSTASIS) IMPLANT
NDL HYPO 25X1 1.5 SAFETY (NEEDLE) IMPLANT
NEEDLE HYPO 25X1 1.5 SAFETY (NEEDLE) ×2 IMPLANT
NS IRRIG 1000ML POUR BTL (IV SOLUTION) ×2 IMPLANT
PACK BASIN DAY SURGERY FS (CUSTOM PROCEDURE TRAY) ×2 IMPLANT
PAD CLEANER CAUTERY TIP 5X5 (MISCELLANEOUS)
PENCIL FOOT CONTROL (ELECTRODE) ×2 IMPLANT
SLEEVE SCD COMPRESS KNEE MED (MISCELLANEOUS) ×1 IMPLANT
SPONGE GAUZE 4X4 12PLY STER LF (GAUZE/BANDAGES/DRESSINGS) IMPLANT
SUCTION FRAZIER TIP 10 FR DISP (SUCTIONS) ×1 IMPLANT
SUT CHROMIC 3 0 PS 2 (SUTURE) ×2 IMPLANT
SUT CHROMIC 3 0 SH 27 (SUTURE) ×1 IMPLANT
SUT CHROMIC 3 0 TIES (SUTURE) IMPLANT
SUT SILK 2 0 TIES 17X18 (SUTURE)
SUT SILK 2-0 18XBRD TIE BLK (SUTURE) IMPLANT
SUT SILK 3 0 TIES 17X18 (SUTURE)
SUT SILK 3-0 18XBRD TIE BLK (SUTURE) IMPLANT
SUT VIC AB 5-0 P-3 18X BRD (SUTURE) IMPLANT
SUT VIC AB 5-0 P3 18 (SUTURE) ×2
SWAB COLLECTION DEVICE MRSA (MISCELLANEOUS) IMPLANT
SYR BULB 3OZ (MISCELLANEOUS) ×2 IMPLANT
SYR CONTROL 10ML LL (SYRINGE) ×1 IMPLANT
TOWEL OR 17X24 6PK STRL BLUE (TOWEL DISPOSABLE) ×4 IMPLANT
TRAY DSU PREP LF (CUSTOM PROCEDURE TRAY) ×1 IMPLANT
TUBE ANAEROBIC SPECIMEN COL (MISCELLANEOUS) IMPLANT
TUBE CONNECTING 20X1/4 (TUBING) ×1 IMPLANT

## 2014-02-13 NOTE — Transfer of Care (Signed)
Immediate Anesthesia Transfer of Care Note  Patient: Christian Ball  Procedure(s) Performed: Procedure(s): EXCISION RIGHT BUCCAL SQUAMOUS CELL CARCINOMA  TIMES 2, EXCISION OF LEFT BUCCAL LESION (Bilateral)  Patient Location: PACU  Anesthesia Type:General  Level of Consciousness: awake, sedated and patient cooperative  Airway & Oxygen Therapy: Patient Spontanous Breathing and Patient connected to face mask oxygen  Post-op Assessment: Report given to PACU RN and Post -op Vital signs reviewed and stable  Post vital signs: Reviewed and stable  Complications: No apparent anesthesia complications

## 2014-02-13 NOTE — Discharge Instructions (Addendum)
Start with liquids today and advance diet as tolerated. Take your regular meds but wait to start plavix in 2 days Keflex 500 mg twice per day for 1 week Tylenol, motrin or hydrocodone prn pain. OK to use topical anesthetic oral spray or lozenges Call office for follow up appt in 1 week   [phone]   Post Anesthesia Home Care Instructions  Activity: Get plenty of rest for the remainder of the day. A responsible adult should stay with you for 24 hours following the procedure.  For the next 24 hours, DO NOT: -Drive a car -Paediatric nurse -Drink alcoholic beverages -Take any medication unless instructed by your physician -Make any legal decisions or sign important papers.  Meals: Start with liquid foods such as gelatin or soup. Progress to regular foods as tolerated. Avoid greasy, spicy, heavy foods. If nausea and/or vomiting occur, drink only clear liquids until the nausea and/or vomiting subsides. Call your physician if vomiting continues.  Special Instructions/Symptoms: Your throat may feel dry or sore from the anesthesia or the breathing tube placed in your throat during surgery. If this causes discomfort, gargle with warm salt water. The discomfort should disappear within 24 hours.

## 2014-02-13 NOTE — Anesthesia Postprocedure Evaluation (Signed)
  Anesthesia Post-op Note  Patient: RONNALD SHEDDEN  Procedure(s) Performed: Procedure(s): EXCISION RIGHT BUCCAL SQUAMOUS CELL CARCINOMA  TIMES 2, EXCISION OF LEFT BUCCAL LESION (Bilateral)  Patient Location: PACU  Anesthesia Type: General   Level of Consciousness: awake, alert  and oriented  Airway and Oxygen Therapy: Patient Spontanous Breathing  Post-op Pain: mild  Post-op Assessment: Post-op Vital signs reviewed  Post-op Vital Signs: Reviewed  Last Vitals:  Filed Vitals:   02/13/14 1156  BP: 115/70  Pulse: 115  Temp: 36.7 C  Resp: 18    Complications: No apparent anesthesia complications. Pt had a bronchospasm after several minutes of anesthesia. Unknown cause. Epi was given with good response.  ETT was removed and replaced without difficulty. Extubation was accomplished after making sure of muscle strength and improved spontaneous respiration. CXR was ordered as I had difficulty hearing good breath sounds on the Right.  Findings were only low lung volumes and bibasilar atelectasis.  Pt was discharged in the care of his wife with instructions to perform deep breathing exercises for at least 24 hours.

## 2014-02-13 NOTE — Brief Op Note (Signed)
02/13/2014  9:18 AM  PATIENT:  Christian Ball  63 y.o. male  PRE-OPERATIVE DIAGNOSIS:  SQUAMOUS CELL CARCINOMA OF BUCCAL MUCOSA  POST-OPERATIVE DIAGNOSIS:  SQUAMOUS CELL CARCINOMA OF BUCCAL MUCOSA  PROCEDURE:  Procedure(s): EXCISION RIGHT BUCCAL SQUAMOUS CELL CARCINOMA  (Right)  SURGEON:  Surgeon(s) and Role:    * Rozetta Nunnery, MD - Primary  PHYSICIAN ASSISTANT:   ASSISTANTS: none   ANESTHESIA:   general  EBL:  Total I/O In: 1000 [I.V.:1000] Out: -   BLOOD ADMINISTERED:none  DRAINS: none   LOCAL MEDICATIONS USED:  XYLOCAINE with EPI  10 cc  SPECIMEN:  Source of Specimen:  right buccal mucosa x 2, left buccal mucosa bx  DISPOSITION OF SPECIMEN:  PATHOLOGY  COUNTS:  YES  TOURNIQUET:  * No tourniquets in log *  DICTATION: .Other Dictation: Dictation Number [id number]  PLAN OF CARE: Discharge to home after PACU  PATIENT DISPOSITION:  PACU - hemodynamically stable.   Delay start of Pharmacological VTE agent (>24hrs) due to surgical blood loss or risk of bleeding: yes

## 2014-02-13 NOTE — Interval H&P Note (Signed)
History and Physical Interval Note:  02/13/2014 7:28 AM  Christian Ball  has presented today for surgery, with the diagnosis of SCRAMOUS CELL CARCINOMA OF BUCCAL MUCOSA  The various methods of treatment have been discussed with the patient and family. After consideration of risks, benefits and other options for treatment, the patient has consented to  Procedure(s): EXCISION RIGHT BUCCAL SQUAMOUS CELL CARCINOMA  (Right) as a surgical intervention .  The patient's history has been reviewed, patient examined, no change in status, stable for surgery.  I have reviewed the patient's chart and labs.  Questions were answered to the patient's satisfaction.     Chelise Hanger

## 2014-02-13 NOTE — Anesthesia Procedure Notes (Addendum)
Procedure Name: Intubation Date/Time: 02/13/2014 7:37 AM Performed by: Lyndee Leo Pre-anesthesia Checklist: Patient identified, Emergency Drugs available, Suction available and Patient being monitored Patient Re-evaluated:Patient Re-evaluated prior to inductionOxygen Delivery Method: Circle System Utilized Preoxygenation: Pre-oxygenation with 100% oxygen Intubation Type: IV induction Ventilation: Mask ventilation without difficulty Laryngoscope Size: Miller and 3 Grade View: Grade III Tube type: Oral Rae Number of attempts: 1 Airway Equipment and Method: stylet and oral airway Placement Confirmation: ETT inserted through vocal cords under direct vision,  positive ETCO2 and breath sounds checked- equal and bilateral Secured at: 22 cm Tube secured with: Tape Dental Injury: Teeth and Oropharynx as per pre-operative assessment    Procedure Name: Intubation Date/Time: 02/13/2014 7:52 AM Performed by: Lyndee Leo Pre-anesthesia Checklist: Patient identified, Emergency Drugs available, Suction available and Patient being monitored Patient Re-evaluated:Patient Re-evaluated prior to inductionOxygen Delivery Method: Circle System Utilized Preoxygenation: Pre-oxygenation with 100% oxygen Intubation Type: IV induction Ventilation: Mask ventilation without difficulty Laryngoscope Size: Miller and 3 Grade View: Grade III Tube type: Oral Number of attempts: 1 Airway Equipment and Method: stylet and oral airway Placement Confirmation: ETT inserted through vocal cords under direct vision,  positive ETCO2 and breath sounds checked- equal and bilateral Secured at: 22 cm Tube secured with: Tape Dental Injury: Teeth and Oropharynx as per pre-operative assessment

## 2014-02-13 NOTE — Anesthesia Preprocedure Evaluation (Addendum)
Anesthesia Evaluation  Patient identified by MRN, date of birth, ID band Patient awake    Reviewed: Allergy & Precautions, H&P , NPO status , Patient's Chart, lab work & pertinent test results  Airway Mallampati: II  TM Distance: >3 FB Neck ROM: Full    Dental  (+) Teeth Intact, Dental Advisory Given   Pulmonary  breath sounds clear to auscultation        Cardiovascular hypertension, Pt. on medications + CAD and + Past MI Rhythm:Regular Rate:Normal     Neuro/Psych    GI/Hepatic   Endo/Other    Renal/GU      Musculoskeletal   Abdominal   Peds  Hematology   Anesthesia Other Findings   Reproductive/Obstetrics                            Anesthesia Physical Anesthesia Plan  ASA: III  Anesthesia Plan: General   Post-op Pain Management:    Induction: Intravenous  Airway Management Planned: Oral ETT  Additional Equipment:   Intra-op Plan:   Post-operative Plan: Extubation in OR  Informed Consent: I have reviewed the patients History and Physical, chart, labs and discussed the procedure including the risks, benefits and alternatives for the proposed anesthesia with the patient or authorized representative who has indicated his/her understanding and acceptance.   Dental advisory given  Plan Discussed with: CRNA, Anesthesiologist and Surgeon  Anesthesia Plan Comments:         Anesthesia Quick Evaluation

## 2014-02-14 ENCOUNTER — Encounter (HOSPITAL_BASED_OUTPATIENT_CLINIC_OR_DEPARTMENT_OTHER): Payer: Self-pay | Admitting: Otolaryngology

## 2014-02-14 NOTE — Op Note (Signed)
NAMELES, LONGMORE NO.:  [id number]  MEDICAL RECORD NO.:  [id number]  LOCATION:                               FACILITY:  [facility]  PHYSICIAN:  Leonides Sake. 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  OPERATION PERFORMED:  Wide re-excision of squamous cell carcinoma of right buccal mucosa, left buccal mucosal biopsy.  SURGEON:  Leonides Sake. Lucia Gaskins, M.D.  ANESTHESIA:  General endotracheal.  COMPLICATIONS:  None.  BRIEF CLINICAL NOTE:  Christian Ball is a 63 year old gentleman, who previously had a abnormal lesion on the right buccal mucosa noted by his dentist.  He had an excisional biopsy of this lesion with Dr. Romie Minus 2 weeks ago that showed squamous cell carcinoma with 3.5 mm thickness with positive margins.  He is taken back to the operating room at this time for wide re-excision of this area.  Also, at time of referral to my office, he had a separate area of leukoplakia and nodule anterior to the previous excision site that appeared abnormal.  He also had some superficial leukoplakia and the left bulky mucosa was all soft.  He is taken to the operating room at this time for wide re-excision and treatment of this.  DESCRIPTION OF PROCEDURE:  The patient underwent general endotracheal anesthesia.  He received 2 g of IV Ancef IV preoperatively.  The right buccal mucosa was initially evaluated, identified the previous excision site.  Back posterior inferior along the buccal mucosa on the right side.  He also had a small nodular lesion just anterior to this previous excision site that he stated that he actually bit his lip in this  area or bit his buccal mucosa in this area, but it appeared about the same as it appeared in my office a week earlier.  This had normal nodular leukoplakic lesion, was elliptically incised for frozen section at the beginning of the case.  While we are awaiting for frozen section, the more posterior area that already previously biopsied was widely excised with approximate gross cm margin around the previous excision site.  The re-excision was carried down to the buccinator muscle.  Superiorly, this excision approached within 6-8 mm of the parotid duct and inferiorly down to the buccal gingival sulcus.  The area was excised and marked with sutures and sent to Pathology.  Frozen section revealed squamous cell carcinoma in the anterior lesion that had previously been excised and this area likewise was re-excised with 4-5 mm margins around the re- excision.  There was another small area of leukoplakia that was very soft and appeared fairly benign that was burn and cauterized along the anterior inferior buccal mucosa.  The remainder of the buccal mucosa on the right side appeared fairly clear.  After obtaining adequate hemostasis, the defect was closed with 3-0 chromic sutures submucosally and then few interrupted 3-0 lesions sutures through the reapproximating mucosa, where  the larger defect was posteriorly.  This was left open. Following excision of the cancer from the right buccal mucosa, the left side was examined.  He had some areas of leukoplakia along the left buccal mucosa that was all soft and appeared fairly benign, but a small biopsy was obtained also from the left buccal mucosa.  Leukoplakia that appeared to be worse.  This completed the procedure.  The patient was awoke from anesthesia and transferred to recovery room, postop doing well.  DISPOSITION:  [name] was discharged home later this morning on Keflex 500 mg b.i.d. for 1 week, Tylenol and hydrocodone 5 mg tablets were  prescribed 1-2 q.6 hours p.r.n. pain.  We will have him follow up in my office in 1 week for recheck and review of final pathology.          ______________________________ Leonides Sake Lucia Gaskins, M.D.     CEN/MEDQ  D:  02/13/2014  T:  02/14/2014  Job:  [id number]  cc:   Nicki Reaper R. Romie Minus, D.D.S. Peter M. Martinique, M.D.

## 2014-03-01 ENCOUNTER — Other Ambulatory Visit: Payer: Self-pay | Admitting: Cardiology

## 2014-03-09 ENCOUNTER — Encounter (HOSPITAL_COMMUNITY): Payer: Self-pay | Admitting: Cardiology

## 2014-04-25 ENCOUNTER — Other Ambulatory Visit: Payer: Self-pay | Admitting: Cardiology

## 2014-04-25 ENCOUNTER — Telehealth: Payer: Self-pay | Admitting: Cardiology

## 2014-04-25 MED ORDER — ISOSORBIDE MONONITRATE ER 30 MG PO TB24: 30.0000 mg | ORAL_TABLET | Freq: Every day | ORAL | Status: DC

## 2014-04-25 NOTE — Telephone Encounter (Signed)
°  1. Which medications need to be refilled? Isosorbide  2. Which pharmacy is medication to be sent to? [facility] [address]  3. Do they need a 30 day or 90 day supply? 90  4. Would they like a call back once the medication has been sent to the pharmacy? Yes  *he stated that Dr. Martinique mentioned going off of Plavix in Decemeber but it was not set in stone and he says that he only has couple of days left on that prescription * He wasn't sure if he needed to have it refilled

## 2014-04-25 NOTE — Telephone Encounter (Signed)
Rx refill sent to patient pharmacy for IMDUR, Rx or Plavix denied per last OV with Dr.Jodan patient can stop Plavix at end of December 2015 Patient notified

## 2014-04-27 ENCOUNTER — Other Ambulatory Visit: Payer: Self-pay | Admitting: Cardiology

## 2014-05-05 ENCOUNTER — Telehealth: Payer: Self-pay | Admitting: Cardiology

## 2014-05-09 NOTE — Telephone Encounter (Signed)
Closed encounter °

## 2014-06-14 ENCOUNTER — Encounter: Payer: Self-pay | Admitting: Cardiology

## 2014-06-17 IMAGING — CR DG CHEST 1V PORT
1 series · 1 of 1 positions shown · non-contrast
Comparison: None.

CLINICAL DATA: Chest pain and dyspnea with history of hypertension

EXAM:
PORTABLE CHEST - 1 VIEW

[AP]
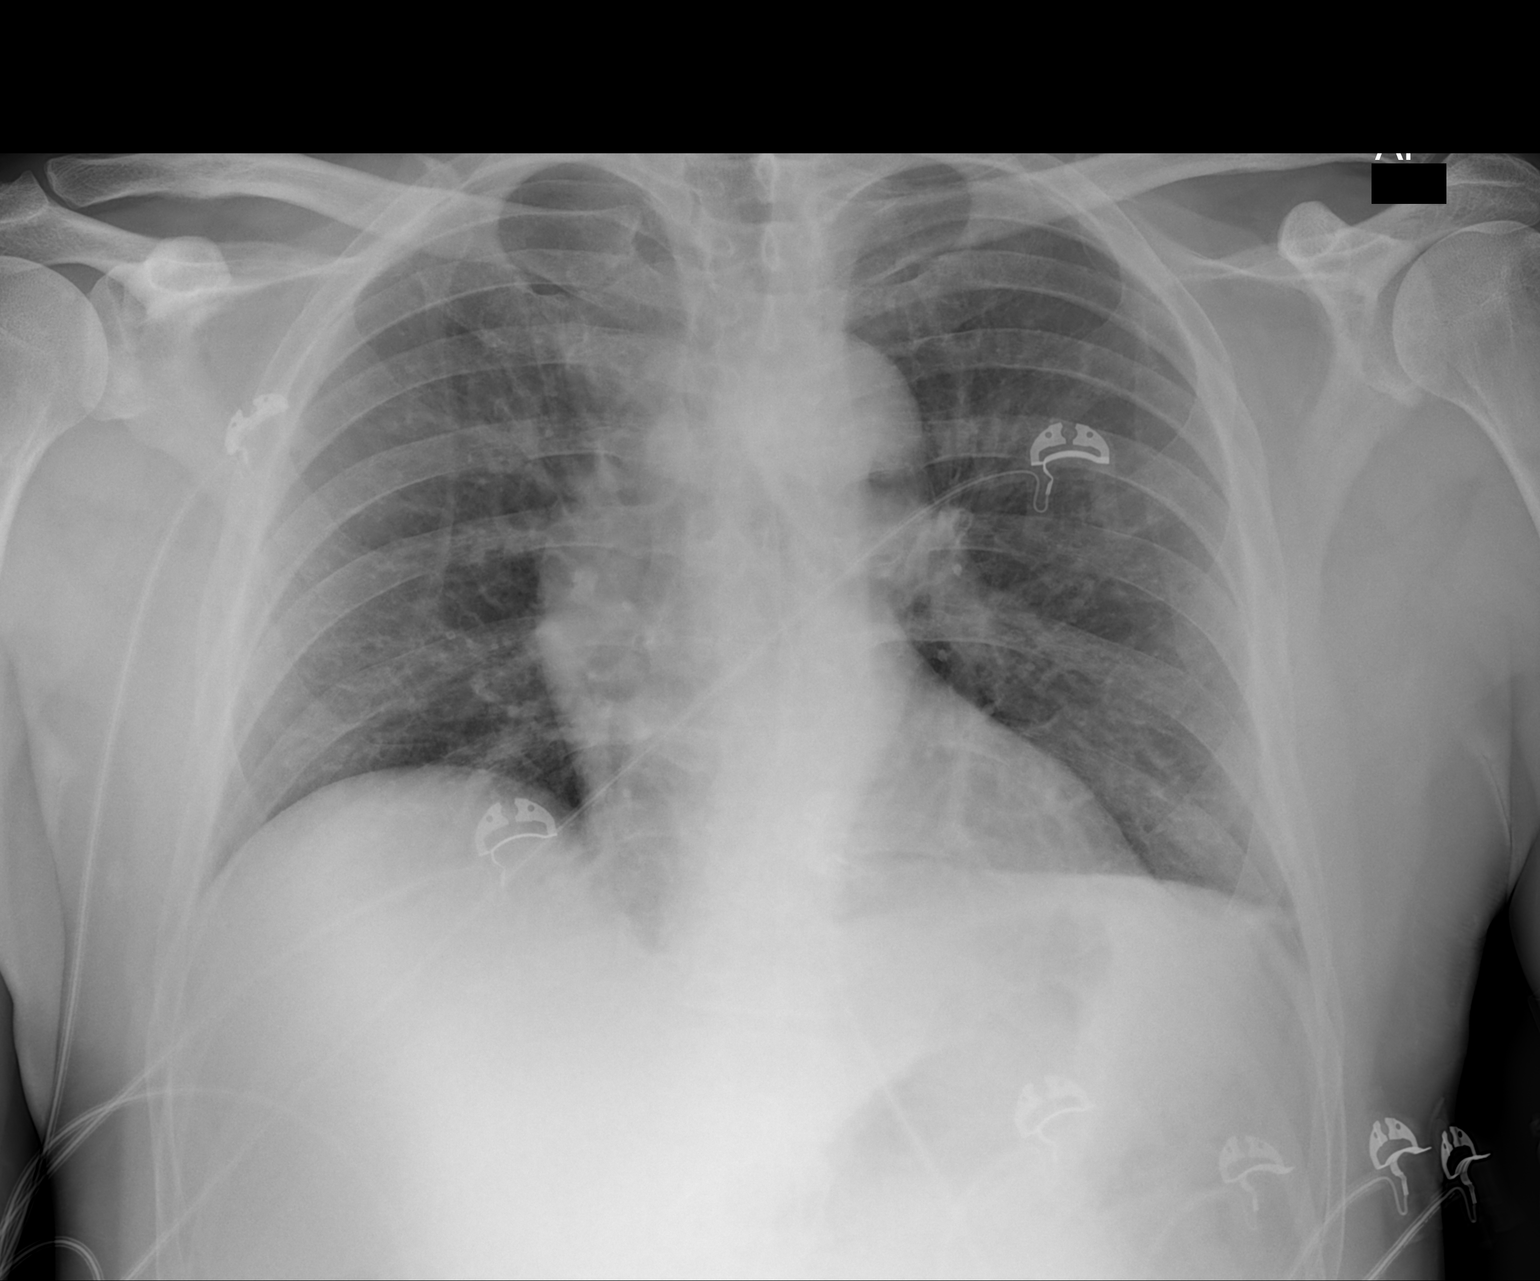

[1 of 1 positions shown; findings below may reference images not displayed]

FINDINGS: The lungs are mildly hypoinflated. There is no focal infiltrate. The
cardiopericardial silhouette is normal in size. The mediastinum is
normal in width. There is tortuosity of the ascending and descending
thoracic aorta. There is no pleural effusion. The mediastinum is
normal in width. There is no pleural effusion or pneumothorax. The
observed portions of the bony thorax appear normal.
IMPRESSION: There is mild bilateral pulmonary hypo inflation. There is no
evidence of pneumonia nor CHF. When the patient can tolerate her
procedure, a PA and lateral chest x-ray with deep inspiration would
be useful if chest discomfort process.

## 2014-06-19 ENCOUNTER — Other Ambulatory Visit: Payer: Self-pay | Admitting: Cardiology

## 2014-06-19 ENCOUNTER — Telehealth: Payer: Self-pay | Admitting: Cardiology

## 2014-06-19 NOTE — Telephone Encounter (Signed)
°  1. Which medications need to be refilled? Atorvastatin  2. Which pharmacy is medication to be sent to?[facility]-[phone] 3. Do they need a 30 day or 90 day supply? 90 and refills  4. Would they like a call back once the medication has been sent to the pharmacy? no

## 2014-06-20 ENCOUNTER — Other Ambulatory Visit: Payer: Self-pay

## 2014-06-20 MED ORDER — ATORVASTATIN CALCIUM 20 MG PO TABS: ORAL_TABLET | ORAL | Status: DC

## 2014-07-26 ENCOUNTER — Encounter: Payer: Self-pay | Admitting: Cardiology

## 2014-07-26 ENCOUNTER — Ambulatory Visit (INDEPENDENT_AMBULATORY_CARE_PROVIDER_SITE_OTHER): Payer: Managed Care, Other (non HMO) | Admitting: Cardiology

## 2014-07-26 VITALS — BP 128/80 | HR 79 | Ht 69.0 in | Wt 188.2 lb

## 2014-07-26 DIAGNOSIS — I2510 Atherosclerotic heart disease of native coronary artery without angina pectoris: Secondary | ICD-10-CM | POA: Diagnosis not present

## 2014-07-26 DIAGNOSIS — I10 Essential (primary) hypertension: Secondary | ICD-10-CM | POA: Diagnosis not present

## 2014-07-26 DIAGNOSIS — E785 Hyperlipidemia, unspecified: Secondary | ICD-10-CM | POA: Diagnosis not present

## 2014-07-26 NOTE — Patient Instructions (Signed)
Continue your current therapy.  Try and exercise more.  I will see you in 6 months.

## 2014-07-26 NOTE — Progress Notes (Signed)
Patient ID: Christian Ball MRN: [id number], DOB/AGE: June 14, 1950   Date of Visit: 07/26/2014  Primary Physician: Gara Kroner, MD Primary Cardiologist:  Angeligue Bowne Martinique, MD   History of Present Illness  Christian Ball is a 64 y.o. male with HTN and dyslipidemia who was admitted 12/28 - 12/30/ 14 with a  NSTEMI. He underwent cardiac cath showing single vessel obstructive CAD involving the second PLOM branch of the RCA, with L-R collaterals. Residual 20-30% mid LAD disease, otherwise normal LCx. Medical therapy was recommended. He was placed on Plavix and Imdur. Peak troponin 4.31. EF was 55-65%. 2D echo was obtained showing basal inferior and inferolateral hypokinesis and mid inferior wall hypokinesis, EF 45-50%, mild MR. Developed hypotension on BB and it was stopped.  On follow up today he reports he is doing well. Denies any chest pain. No SOB. Feels very well. Sleeping well. Does note he is not exercising as much.   Past Medical History Past Medical History  Diagnosis Date  . Hypertension   . Dyslipidemia   . CAD (coronary artery disease)     a. 02/2013: NSTEMI -> cath 2nd PLOM branch with L-R collaterals, for medical rx. Consider PCI if refractory sx.  . Leukocytosis     a. During 03/2103 adm. May need OP recheck.  . Nocturnal oxygen desaturation     a. Instructed to f/u PCP for possible sleep study.  . Ischemic cardiomyopathy     a. 02/2013 adm - EF 45-50% by echo, 55-65% by cath.  . Situational stress     Past Surgical History None  Allergies/Intolerances Allergies  Allergen Reactions  . Codeine Rash  . Penicillins Rash  . Sulfa Antibiotics Rash    Current Home Medications Current Outpatient Prescriptions  Medication Sig Dispense Refill  . aspirin EC 81 MG tablet Take 81 mg by mouth daily.    Marland Kitchen atorvastatin (LIPITOR) 20 MG tablet TAKE 1 TABLET (20 MG TOTAL) BY MOUTH DAILY. 90 tablet 1  . escitalopram (LEXAPRO) 5 MG tablet Take 10 mg by mouth daily.     . isosorbide  mononitrate (IMDUR) 30 MG 24 hr tablet Take 1 tablet (30 mg total) by mouth daily. 90 tablet 2  . lisinopril (PRINIVIL,ZESTRIL) 20 MG tablet Take 20 mg by mouth daily.    . Multiple Vitamin (MULTIVITAMIN WITH MINERALS) TABS tablet Take 1 tablet by mouth daily.    . nitroGLYCERIN (NITROSTAT) 0.4 MG SL tablet Place 1 tablet (0.4 mg total) under the tongue every 5 (five) minutes as needed for chest pain (up to 3 doses). 25 tablet 3   No current facility-administered medications for this visit.    Social History History   Social History  . Marital Status: Married    Spouse Name: N/A  . Number of Children: N/A  . Years of Education: N/A   Occupational History  . Not on file.   Social History Main Topics  . Smoking status: Never Smoker   . Smokeless tobacco: Not on file     Comment: has had occasional cigar.  . Alcohol Use: Yes     Comment: 2 drinks/year  . Drug Use: No  . Sexual Activity: Yes   Other Topics Concern  . Not on file   Social History Narrative   Lives in [place] with his wife.  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  Not currently exercising on routine basis.     Review of Systems As noted in HPI. All other systems reviewed and are  otherwise negative except as noted above.  Physical Exam Vitals: Blood pressure 128/80, pulse 79, height 5\' 9"  (1.753 m), weight 188 lb 3.2 oz (85.367 kg).  General: Well developed, well appearing 64 y.o. male in no acute distress. HEENT: Normocephalic, atraumatic. EOMs intact. Sclera nonicteric. Oropharynx clear.  Neck: Supple without bruits. No JVD. Lungs: Respirations regular and unlabored, CTA bilaterally. No wheezes, rales or rhonchi. Heart: RRR. S1, S2 present. No murmurs, rub, S3 or S4. Abdomen: Soft, non-distended.  Extremities: No clubbing, cyanosis or edema. PT/Radials 2+ and equal bilaterally. Psych: Normal affect. Neuro: Alert and oriented X 3. Moves all extremities spontaneously.   Diagnostics Ecg today shows NSR with normal  Ecg. I have personally reviewed and interpreted this study.  Labs from March 2016- chol- 108, trig-58, HDL 44, LDL 53.  Assessment and Plan 1.  CAD s/p  NSTEMI 03/14/13, currently asymptomatic. - 2nd PLOM branch 90% with L-R collaterals, medical management recommended - continue medical therapy -Continue dietary and exercise modifications. 2. LV dysfunction, EF 45-50% by echo but 55-65% by cath  - stable; no signs of volume overload 3. HTN - BP well controlled today. - continue current regimen 4. Dyslipidemia- excellent control. - continue statin   I will follow up in 6 months. I will follow up in 6 months.

## 2014-12-09 ENCOUNTER — Other Ambulatory Visit: Payer: Self-pay | Admitting: Cardiology

## 2014-12-25 ENCOUNTER — Encounter: Payer: Self-pay | Admitting: Cardiology

## 2015-01-05 ENCOUNTER — Other Ambulatory Visit: Payer: Self-pay

## 2015-01-05 MED ORDER — ISOSORBIDE MONONITRATE ER 30 MG PO TB24: 30.0000 mg | ORAL_TABLET | Freq: Every day | ORAL | Status: DC

## 2015-02-12 ENCOUNTER — Ambulatory Visit: Payer: Managed Care, Other (non HMO) | Admitting: Cardiology

## 2015-02-15 ENCOUNTER — Encounter: Payer: Self-pay | Admitting: Cardiology

## 2015-02-15 ENCOUNTER — Ambulatory Visit (INDEPENDENT_AMBULATORY_CARE_PROVIDER_SITE_OTHER): Payer: Managed Care, Other (non HMO) | Admitting: Cardiology

## 2015-02-15 VITALS — BP 130/78 | HR 85 | Ht 68.0 in | Wt 190.0 lb

## 2015-02-15 DIAGNOSIS — I10 Essential (primary) hypertension: Secondary | ICD-10-CM

## 2015-02-15 DIAGNOSIS — E785 Hyperlipidemia, unspecified: Secondary | ICD-10-CM

## 2015-02-15 DIAGNOSIS — I2510 Atherosclerotic heart disease of native coronary artery without angina pectoris: Secondary | ICD-10-CM | POA: Diagnosis not present

## 2015-02-15 MED ORDER — ATORVASTATIN CALCIUM 20 MG PO TABS: 20.0000 mg | ORAL_TABLET | Freq: Every day | ORAL | Status: DC

## 2015-02-15 NOTE — Patient Instructions (Signed)
Continue your current therapy  I will see you in 6 months.   

## 2015-02-15 NOTE — Progress Notes (Signed)
Patient ID: Christian Ball MRN: [id number], DOB/AGE: [birth year]   Date of Visit: 02/15/2015  Primary Physician: Christian Kroner, MD Primary Cardiologist:  Christian Martinique, MD   History of Present Illness  Christian Ball is a 64 y.o. male with HTN and dyslipidemia who was admitted 12/ 14 with a  NSTEMI. He underwent cardiac cath showing single vessel obstructive CAD involving the second PLOM branch of the RCA, with L-R collaterals. Residual 20-30% mid LAD disease, otherwise normal LCx. Medical therapy was recommended. He was placed on Plavix and Imdur. Peak troponin 4.31. EF was 55-65%. 2D echo was obtained showing basal inferior and inferolateral hypokinesis and mid inferior wall hypokinesis, EF 45-50%, mild MR. Developed hypotension on BB and it was stopped.  On follow up today he reports he is doing well. Denies any chest pain. No SOB. Feels very well. He is getting some exercise. 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   Past Medical History Past Medical History  Diagnosis Date  . Hypertension   . Dyslipidemia   . CAD (coronary artery disease)     a. 02/2013: NSTEMI -> cath 2nd PLOM branch with L-R collaterals, for medical rx. Consider PCI if refractory sx.  . Leukocytosis     a. During 03/2103 adm. May need OP recheck.  . Nocturnal oxygen desaturation     a. Instructed to f/u PCP for possible sleep study.  . Ischemic cardiomyopathy     a. 02/2013 adm - EF 45-50% by echo, 55-65% by cath.  . Situational stress     Past Surgical History None  Allergies/Intolerances Allergies  Allergen Reactions  . Codeine Rash  . Penicillins Rash  . Sulfa Antibiotics Rash    Current Home Medications Current Outpatient Prescriptions  Medication Sig Dispense Refill  . aspirin EC 81 MG tablet Take 81 mg by mouth daily.    Marland Kitchen atorvastatin (LIPITOR) 20 MG tablet Take 1 tablet (20 mg total) by mouth daily at 6 PM. 90 tablet 3  . escitalopram  (LEXAPRO) 5 MG tablet Take 10 mg by mouth daily.     . isosorbide mononitrate (IMDUR) 30 MG 24 hr tablet Take 1 tablet (30 mg total) by mouth daily. 90 tablet 2  . lisinopril (PRINIVIL,ZESTRIL) 20 MG tablet Take 20 mg by mouth daily.    . Multiple Vitamin (MULTIVITAMIN WITH MINERALS) TABS tablet Take 1 tablet by mouth daily.    . nitroGLYCERIN (NITROSTAT) 0.4 MG SL tablet Place 1 tablet (0.4 mg total) under the tongue every 5 (five) minutes as needed for chest pain (up to 3 doses). 25 tablet 3   No current facility-administered medications for this visit.    Social History Social History   Social History  . Marital Status: Married    Spouse Name: N/A  . Number of Children: N/A  . Years of Education: N/A   Occupational History  . Not on file.   Social History Main Topics  . Smoking status: Never Smoker   . Smokeless tobacco: Not on file     Comment: has had occasional cigar.  . Alcohol Use: Yes     Comment: 2 drinks/year  . Drug Use: No  . Sexual Activity: Yes   Other Topics Concern  . Not on file   Social History Narrative   Lives in [place] with his wife.  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  Not currently exercising on routine basis.  Review of Systems As noted in HPI. All other systems reviewed and are otherwise negative except as noted above.  Physical Exam Vitals: Blood pressure 130/78, pulse 85, height 5\' 8"  (1.727 m), weight 86.183 kg (190 lb).  General: Well developed, well appearing 64 y.o. male in no acute distress. HEENT: Normocephalic, atraumatic. EOMs intact. Sclera nonicteric. Oropharynx clear.  Neck: Supple without bruits. No JVD. Lungs: Respirations regular and unlabored, CTA bilaterally. No wheezes, rales or rhonchi. Heart: RRR. S1, S2 present. No murmurs, rub, S3 or S4. Abdomen: Soft, non-distended.  Extremities: No clubbing, cyanosis or edema. PT/Radials 2+ and equal bilaterally. Psych: Normal affect. Neuro: Alert and oriented X 3. Moves all  extremities spontaneously.   Diagnostics   Labs from September 2016- chol- 1068, trig-67, HDL 47, LDL 50.  Assessment and Plan 1.  CAD s/p  NSTEMI 03/14/13, currently asymptomatic. - 2nd PLOM branch 90% with L-R collaterals, medical management recommended - continue medical therapy -Continue dietary and exercise modifications.  2.  HTN - BP well controlled today. - continue current regimen  3. Dyslipidemia- excellent control. - continue statin   4. SSCA of the tongue s/p resection.  I will follow up in 6 months.

## 2015-05-06 IMAGING — CR DG CHEST 1V PORT
1 series · 1 of 1 positions shown · non-contrast
Comparison: 03/27/2013

CLINICAL DATA: Status post excision of right buccal squamous cell
carcinoma.

EXAM:
PORTABLE CHEST - 1 VIEW

[AP]
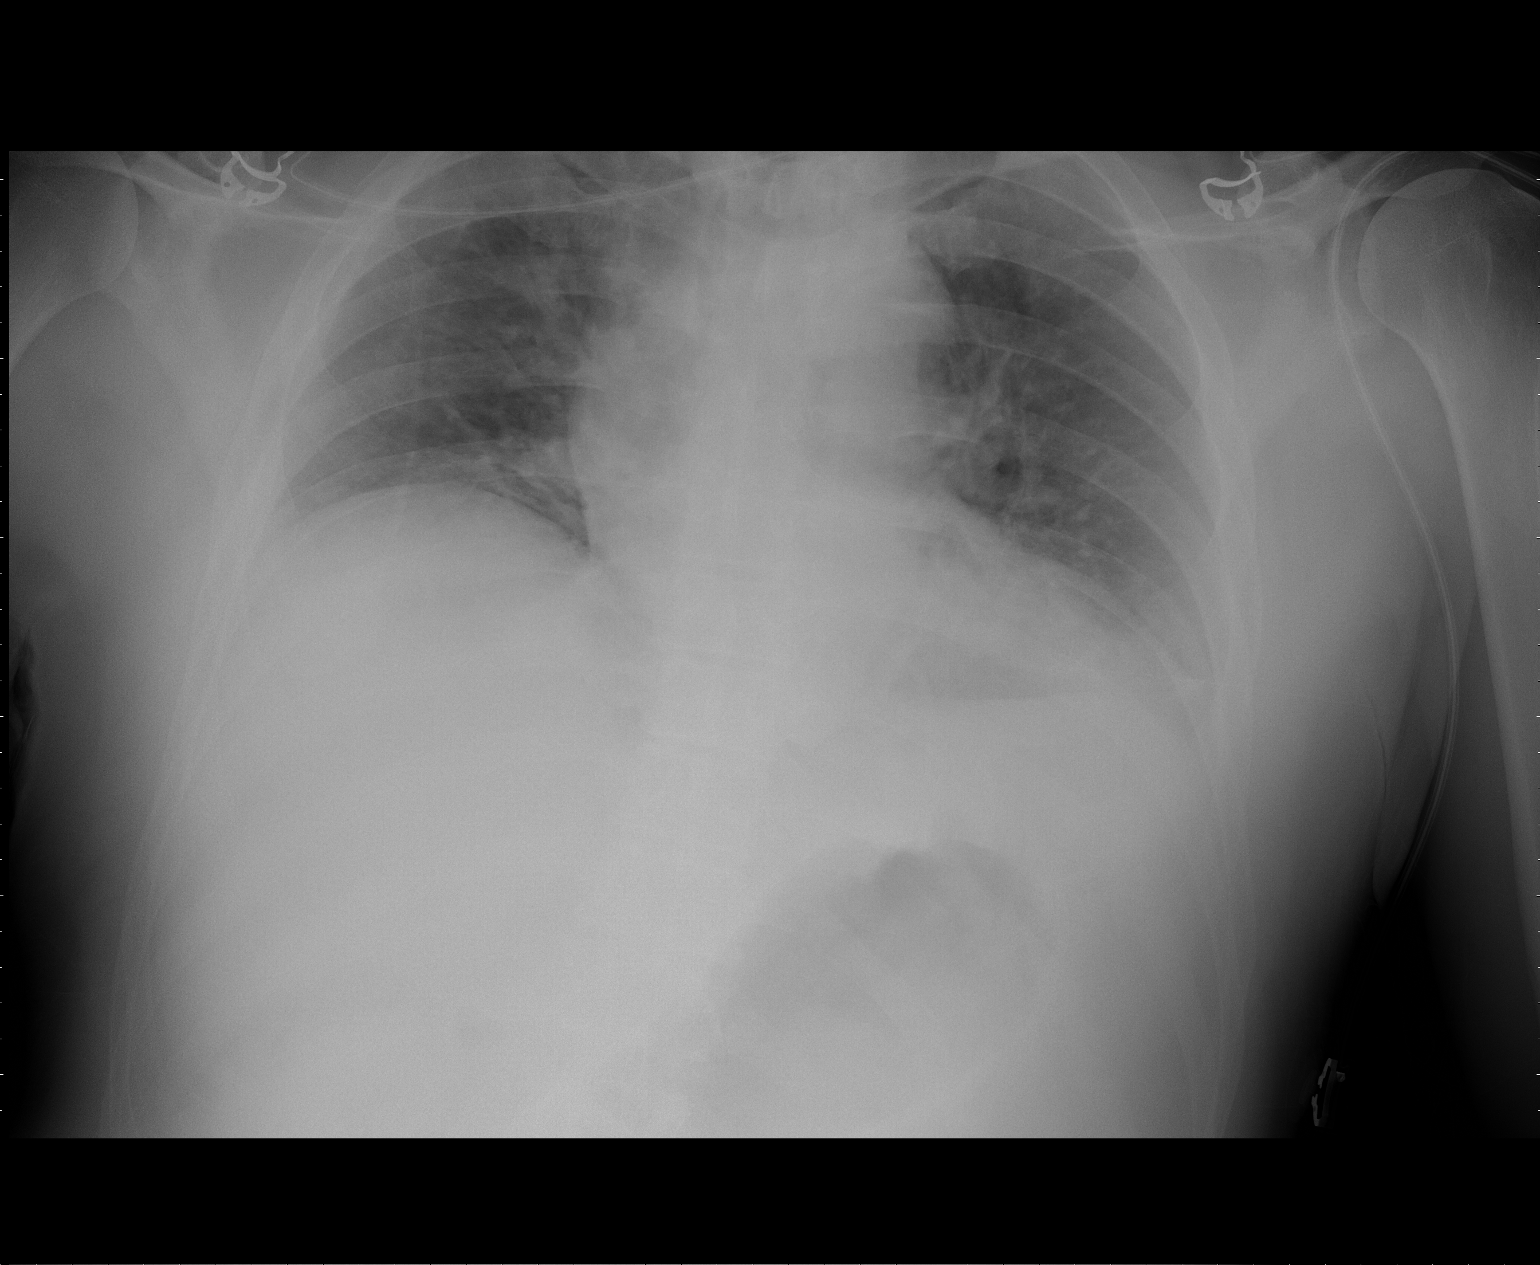

[1 of 1 positions shown; findings below may reference images not displayed]

FINDINGS: The heart size appears normal. The lung volumes are low. There is
atelectasis noted in both lung bases. No pleural effusion or edema
noted.
IMPRESSION: 1. Low lung volumes with bibasilar atelectasis.

## 2015-05-28 ENCOUNTER — Other Ambulatory Visit: Payer: Self-pay | Admitting: Otolaryngology

## 2015-06-01 DIAGNOSIS — D329 Benign neoplasm of meninges, unspecified: Secondary | ICD-10-CM | POA: Diagnosis not present

## 2015-06-07 HISTORY — PX: BRAIN MENINGIOMA EXCISION: SHX576

## 2015-07-31 ENCOUNTER — Ambulatory Visit (INDEPENDENT_AMBULATORY_CARE_PROVIDER_SITE_OTHER): Payer: Managed Care, Other (non HMO) | Admitting: Cardiology

## 2015-07-31 ENCOUNTER — Encounter: Payer: Self-pay | Admitting: Cardiology

## 2015-07-31 VITALS — BP 146/78 | HR 77 | Ht 68.0 in | Wt 184.8 lb

## 2015-07-31 DIAGNOSIS — E785 Hyperlipidemia, unspecified: Secondary | ICD-10-CM

## 2015-07-31 DIAGNOSIS — I2510 Atherosclerotic heart disease of native coronary artery without angina pectoris: Secondary | ICD-10-CM | POA: Diagnosis not present

## 2015-07-31 DIAGNOSIS — I10 Essential (primary) hypertension: Secondary | ICD-10-CM

## 2015-07-31 NOTE — Patient Instructions (Signed)
Monitor your blood pressure  I will see you in 6 months.

## 2015-08-01 NOTE — Progress Notes (Signed)
Patient ID: Christian Ball MRN: [id number], DOB/AGE: 05/25/1950   Date of Visit: 08/01/2015  Primary Physician: Gara Kroner, MD Primary Cardiologist:  Peter Martinique, MD   History of Present Illness  Christian Ball is a 65 y.o. male with HTN and dyslipidemia who was admitted 12/ 14 with a  NSTEMI. He underwent cardiac cath showing single vessel obstructive CAD involving the second PLOM branch of the RCA, with L-R collaterals. Residual 20-30% mid LAD disease, otherwise normal LCx. Medical therapy was recommended. He was placed on Plavix and Imdur. Peak troponin 4.31. EF was 55-65%. 2D echo was obtained showing basal inferior and inferolateral hypokinesis and mid inferior wall hypokinesis, EF 45-50%, mild MR. Developed hypotension on BB and it was stopped. He also has a history of SSCA of the tongue.   Recently his wife noted some protrusion of his left eye. He was evaluated and found to have a mass in the sphenoid region. He had resection of a meningioma on March 9. He did very well until last Tuesday he developed a sharp pain in his left chest. It was worse with breathing. He was admitted. Serial troponins and Ecg were normal. CT of the chest was negative for PE but suggested an early PNA. Echo was normal. He was treated with antibiotics with improvement. Today he denies any chest pain, SOb, cough, edema, or dizziness.   Past Medical History Past Medical History  Diagnosis Date  . Hypertension   . Dyslipidemia   . CAD (coronary artery disease)     a. 02/2013: NSTEMI -> cath 2nd PLOM branch with L-R collaterals, for medical rx. Consider PCI if refractory sx.  . Leukocytosis     a. During 03/2103 adm. May need OP recheck.  . Nocturnal oxygen desaturation     a. Instructed to f/u PCP for possible sleep study.  . Ischemic cardiomyopathy     a. 02/2013 adm - EF 45-50% by echo, 55-65% by cath.  . Situational stress   . Meningioma ([place])   . Hospital acquired PNA     Past Surgical  History None  Allergies/Intolerances Allergies  Allergen Reactions  . Codeine Rash  . Penicillins Rash  . Sulfa Antibiotics Rash    Current Home Medications Current Outpatient Prescriptions  Medication Sig Dispense Refill  . aspirin EC 81 MG tablet Take 81 mg by mouth daily.    Marland Kitchen atorvastatin (LIPITOR) 20 MG tablet Take 1 tablet (20 mg total) by mouth daily at 6 PM. 90 tablet 3  . escitalopram (LEXAPRO) 5 MG tablet Take 10 mg by mouth daily.     . isosorbide mononitrate (IMDUR) 30 MG 24 hr tablet Take 1 tablet (30 mg total) by mouth daily. 90 tablet 2  . levofloxacin (LEVAQUIN) 750 MG tablet Take 750 mg by mouth daily. Last day 08/01/15    . lisinopril (PRINIVIL,ZESTRIL) 20 MG tablet Take 20 mg by mouth daily.    . Multiple Vitamin (MULTIVITAMIN WITH MINERALS) TABS tablet Take 1 tablet by mouth daily.    . nitroGLYCERIN (NITROSTAT) 0.4 MG SL tablet Place 1 tablet (0.4 mg total) under the tongue every 5 (five) minutes as needed for chest pain (up to 3 doses). 25 tablet 3   No current facility-administered medications for this visit.    Social History Social History   Social History  . Marital Status: Married    Spouse Name: N/A  . Number of Children: N/A  . Years of Education: N/A   Occupational History  .  Not on file.   Social History Main Topics  . Smoking status: Never Smoker   . Smokeless tobacco: Not on file     Comment: has had occasional cigar.  . Alcohol Use: Yes     Comment: 2 drinks/year  . Drug Use: No  . Sexual Activity: Yes   Other Topics Concern  . Not on file   Social History Narrative   Lives in [place] with his wife.  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  Not currently exercising on routine basis.     Review of Systems As noted in HPI. All other systems reviewed and are otherwise negative except as noted above.  Physical Exam Vitals: Blood pressure 146/78, pulse 77, height 5\' 8"  (1.727 m), weight 83.825 kg (184 lb 12.8 oz).  General: Well developed,  well appearing 65 y.o. male in no acute distress. HEENT: Normocephalic, atraumatic. EOMs intact. Sclera nonicteric. Oropharynx clear.  Neck: Supple without bruits. No JVD. Lungs: Respirations regular and unlabored, CTA bilaterally. No wheezes, rales or rhonchi. Heart: RRR. S1, S2 present. No murmurs, rub, S3 or S4. Abdomen: Soft, non-distended.  Extremities: No clubbing, cyanosis or edema. PT/Radials 2+ and equal bilaterally. Psych: Normal affect. Neuro: Alert and oriented X 3. Moves all extremities spontaneously.   [facility] records reviewed from [facility] and pertinent findings noted above.   Ecg today shows NSR rate 77, LAD, possible inferior infarct-old. I have personally reviewed and interpreted this study.  Assessment and Plan 1.  CAD s/p  NSTEMI 03/14/13, currently asymptomatic. - 2nd PLOM branch 90% with L-R collaterals, medical management recommended - continue medical therapy with ASA and Imdur.  -Continue dietary and exercise modifications.  2.  HTN - BP well controlled today. - continue current regimen- patient monitors BP at home  3. Dyslipidemia- excellent control. - continue statin   4. SSCA of the tongue s/p resection.  5. S/p resection of meningioma  6. PNA ? Hospital acquired. Complete course of antibiotics.   I will follow up in 6 months.

## 2015-08-16 ENCOUNTER — Ambulatory Visit: Payer: Managed Care, Other (non HMO) | Admitting: Cardiology

## 2015-10-12 ENCOUNTER — Other Ambulatory Visit: Payer: Self-pay | Admitting: Cardiology

## 2016-01-07 ENCOUNTER — Other Ambulatory Visit: Payer: Self-pay | Admitting: Otolaryngology

## 2016-01-11 ENCOUNTER — Other Ambulatory Visit: Payer: Self-pay | Admitting: Otolaryngology

## 2016-01-11 DIAGNOSIS — C062 Malignant neoplasm of retromolar area: Secondary | ICD-10-CM

## 2016-01-14 ENCOUNTER — Ambulatory Visit
Admission: RE | Admit: 2016-01-14 | Discharge: 2016-01-14 | Disposition: A | Discharge status: Home / Self Care | Payer: Managed Care, Other (non HMO) | Source: Ambulatory Visit | Attending: Otolaryngology | Admitting: Otolaryngology

## 2016-01-14 DIAGNOSIS — C062 Malignant neoplasm of retromolar area: Secondary | ICD-10-CM

## 2016-01-14 MED ORDER — IOPAMIDOL (ISOVUE-300) INJECTION 61%
75.0000 mL | Freq: Once | INTRAVENOUS | Status: AC | PRN
  Administered 2016-01-14: 75 mL via INTRAVENOUS

## 2016-01-21 ENCOUNTER — Inpatient Hospital Stay (HOSPITAL_COMMUNITY)
Admission: RE | Admit: 2016-01-21 | Payer: Managed Care, Other (non HMO) | Source: Ambulatory Visit | Admitting: Otolaryngology

## 2016-01-21 ENCOUNTER — Encounter (HOSPITAL_COMMUNITY): Admission: RE | Payer: Self-pay | Source: Ambulatory Visit

## 2016-01-21 SURGERY — EXCISION, NEOPLASM, MOUTH
Anesthesia: General | Laterality: Right

## 2016-03-17 NOTE — Progress Notes (Signed)
Patient ID: Christian Ball MRN: [id number], DOB/AGE: 1951-02-28   Date of Visit: 03/18/2016  Primary Physician: Gara Kroner, MD Primary Cardiologist:  Karren Newland Martinique, MD   History of Present Illness  Christian Ball is a 65 y.o. male with HTN and dyslipidemia who was admitted 12/ 14 with a  NSTEMI. He underwent cardiac cath showing single vessel obstructive CAD involving the second PLOM branch of the RCA, with L-R collaterals. Residual 20-30% mid LAD disease, otherwise normal LCx. Medical therapy was recommended. He was placed on Plavix and Imdur. Peak troponin 4.31. EF was 55-65%. 2D echo was obtained showing basal inferior and inferolateral hypokinesis and mid inferior wall hypokinesis, EF 45-50%, mild MR. Developed hypotension on BB and it was stopped. He also has a history of SSCA of the tongue.   In March 2017 he was evaluated and found to have a mass in the sphenoid region. He had resection of a meningioma on March 9. He did very well until last Tuesday he developed a sharp pain in his left chest. It was worse with breathing. He was admitted. Serial troponins and Ecg were normal. CT of the chest was negative for PE but suggested an early PNA. Echo was normal.   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 According to the patient it was a small mass with clear margins and all lymph nodes negative. No further Chemo or RT planned.   Past Medical History Past Medical History:  Diagnosis Date  . CAD (coronary artery disease)    a. 02/2013: NSTEMI -> cath 2nd PLOM branch with L-R collaterals, for medical rx. Consider PCI if refractory sx.  . Dyslipidemia   . Hospital acquired PNA   . Hypertension   . Ischemic cardiomyopathy    a. 02/2013 adm - EF 45-50% by echo, 55-65% by cath.  . Leukocytosis    a. During 03/2103 adm. May need OP recheck.  . Meningioma ([place])   . Nocturnal  oxygen desaturation    a. Instructed to f/u PCP for possible sleep study.  . Situational stress     Past Surgical History None  Allergies/Intolerances Allergies  Allergen Reactions  . Codeine Rash  . Penicillins Rash  . Sulfa Antibiotics Rash    Current Home Medications Current Outpatient Prescriptions  Medication Sig Dispense Refill  . aspirin EC 81 MG tablet Take 325 mg by mouth daily.     Marland Kitchen atorvastatin (LIPITOR) 20 MG tablet Take 1 tablet (20 mg total) by mouth daily at 6 PM. 90 tablet 3  . escitalopram (LEXAPRO) 5 MG tablet Take 10 mg by mouth daily.     . isosorbide mononitrate (IMDUR) 30 MG 24 hr tablet TAKE 1 TABLET (30 MG TOTAL) BY MOUTH DAILY. 90 tablet 3  . lisinopril (PRINIVIL,ZESTRIL) 20 MG tablet Take 20 mg by mouth daily.    . Multiple Vitamin (MULTIVITAMIN WITH MINERALS) TABS tablet Take 1 tablet by mouth daily.    . nitroGLYCERIN (NITROSTAT) 0.4 MG SL tablet Place 1 tablet (0.4 mg total) under the tongue every 5 (five) minutes as needed for chest pain (up to 3 doses). 25 tablet 3   No current facility-administered medications for this visit.     Social History Social History   Social History  . Marital status: Married    Spouse name: N/A  . Number of children: N/A  . Years of education: N/A  Occupational History  . Not on file.   Social History Main Topics  . Smoking status: Never Smoker  . Smokeless tobacco: Not on file     Comment: has had occasional cigar.  . Alcohol use Yes     Comment: 2 drinks/year  . Drug use: No  . Sexual activity: Yes   Other Topics Concern  . Not on file   Social History Narrative   Lives in [place] with his wife.  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  Not currently exercising on routine basis.     Review of Systems As noted in HPI. All other systems reviewed and are otherwise negative except as noted above.  Physical Exam Vitals: Blood pressure 122/86, height 5\' 8"  (1.727 m), weight 184 lb 9.6 oz (83.7 kg).  General:  Well developed, well appearing 65 y.o. male in no acute distress. HEENT: Normocephalic.  EOMs intact. Sclera nonicteric. Oropharynx clear. Healing surgical incisions.  Neck: Supple without bruits. No JVD. Lungs: Respirations regular and unlabored, CTA bilaterally. No wheezes, rales or rhonchi. Heart: RRR. S1, S2 present. No murmurs, rub, S3 or S4. Abdomen: Soft, non-distended.  Extremities: No clubbing, cyanosis or edema. PT/Radials 2+ and equal bilaterally. Right arm with surgical scars and dressing. Psych: Normal affect. Neuro: Alert and oriented X 3. Moves all extremities spontaneously.   Laboratory data:  Lab Results  Component Value Date   WBC 12.1 (H) 03/29/2013   HGB 14.6 02/13/2014   HCT 34.4 (L) 03/29/2013   PLT 238 03/29/2013   GLUCOSE 84 02/08/2014   CHOL 146 03/28/2013   TRIG 138 03/28/2013   HDL 38 (L) 03/28/2013   LDLCALC 80 03/28/2013   ALT 23 03/27/2013   AST 30 03/27/2013   NA 138 02/08/2014   K 3.6 (L) 02/08/2014   CL 95 (L) 02/08/2014   CREATININE 1.08 02/08/2014   BUN 17 02/08/2014   CO2 30 02/08/2014   TSH 1.239 03/27/2013   INR 1.00 03/29/2013   HGBA1C 5.6 03/27/2013   Labs dated 01/07/16: cholesterol 106, triglycerides 79, LDL 51, HDL 39. CMET, TSH normal. Hgb 14.5 Dated 02/28/16: normal BMET, WBC 13.2. Hgb 9.7   Assessment and Plan 1.  CAD s/p  NSTEMI 03/14/13, currently asymptomatic. - 2nd PLOM branch 90% with L-R collaterals, medical management recommended - continue medical therapy with ASA and Imdur.  -Continue dietary and exercise modifications.  2.  HTN - BP well controlled today. - continue current regimen- patient monitors BP at home  3. Dyslipidemia- excellent control. - continue statin   4. SSCA of the tongue s/p resection. Recent recurrence. S/p modified radical neck resection. Appears to be recovering well.  5. S/p resection of meningioma  I will follow up in 6 months.

## 2016-03-18 ENCOUNTER — Encounter: Payer: Self-pay | Admitting: Cardiology

## 2016-03-18 ENCOUNTER — Ambulatory Visit (INDEPENDENT_AMBULATORY_CARE_PROVIDER_SITE_OTHER): Payer: Managed Care, Other (non HMO) | Admitting: Cardiology

## 2016-03-18 VITALS — BP 122/86 | Ht 68.0 in | Wt 184.6 lb

## 2016-03-18 DIAGNOSIS — E785 Hyperlipidemia, unspecified: Secondary | ICD-10-CM

## 2016-03-18 DIAGNOSIS — I10 Essential (primary) hypertension: Secondary | ICD-10-CM

## 2016-03-18 DIAGNOSIS — I2510 Atherosclerotic heart disease of native coronary artery without angina pectoris: Secondary | ICD-10-CM

## 2016-03-18 NOTE — Patient Instructions (Addendum)
Continue your current therapy  I will see you in 6 months.   

## 2016-04-02 ENCOUNTER — Other Ambulatory Visit: Payer: Self-pay | Admitting: Cardiology

## 2016-07-01 DIAGNOSIS — I10 Essential (primary) hypertension: Secondary | ICD-10-CM | POA: Diagnosis not present

## 2016-07-14 DIAGNOSIS — F329 Major depressive disorder, single episode, unspecified: Secondary | ICD-10-CM | POA: Diagnosis not present

## 2016-07-14 DIAGNOSIS — D329 Benign neoplasm of meninges, unspecified: Secondary | ICD-10-CM | POA: Diagnosis not present

## 2016-07-14 DIAGNOSIS — I10 Essential (primary) hypertension: Secondary | ICD-10-CM | POA: Diagnosis not present

## 2016-07-14 DIAGNOSIS — I2510 Atherosclerotic heart disease of native coronary artery without angina pectoris: Secondary | ICD-10-CM | POA: Diagnosis not present

## 2016-07-14 DIAGNOSIS — L409 Psoriasis, unspecified: Secondary | ICD-10-CM | POA: Diagnosis not present

## 2016-07-14 DIAGNOSIS — I252 Old myocardial infarction: Secondary | ICD-10-CM | POA: Diagnosis not present

## 2016-07-14 DIAGNOSIS — C069 Malignant neoplasm of mouth, unspecified: Secondary | ICD-10-CM | POA: Diagnosis not present

## 2016-07-14 DIAGNOSIS — E7800 Pure hypercholesterolemia, unspecified: Secondary | ICD-10-CM | POA: Diagnosis not present

## 2016-08-19 DIAGNOSIS — R252 Cramp and spasm: Secondary | ICD-10-CM | POA: Diagnosis not present

## 2016-08-19 DIAGNOSIS — R1312 Dysphagia, oropharyngeal phase: Secondary | ICD-10-CM | POA: Diagnosis not present

## 2016-08-22 DIAGNOSIS — C069 Malignant neoplasm of mouth, unspecified: Secondary | ICD-10-CM | POA: Diagnosis not present

## 2016-08-22 DIAGNOSIS — H0259 Other disorders affecting eyelid function: Secondary | ICD-10-CM | POA: Diagnosis not present

## 2016-08-22 DIAGNOSIS — D329 Benign neoplasm of meninges, unspecified: Secondary | ICD-10-CM | POA: Diagnosis not present

## 2016-08-22 DIAGNOSIS — Z87898 Personal history of other specified conditions: Secondary | ICD-10-CM | POA: Diagnosis not present

## 2016-09-02 DIAGNOSIS — Z9889 Other specified postprocedural states: Secondary | ICD-10-CM | POA: Diagnosis not present

## 2016-09-02 DIAGNOSIS — G9389 Other specified disorders of brain: Secondary | ICD-10-CM | POA: Diagnosis not present

## 2016-09-02 DIAGNOSIS — Z08 Encounter for follow-up examination after completed treatment for malignant neoplasm: Secondary | ICD-10-CM | POA: Diagnosis not present

## 2016-09-02 DIAGNOSIS — Z85818 Personal history of malignant neoplasm of other sites of lip, oral cavity, and pharynx: Secondary | ICD-10-CM | POA: Diagnosis not present

## 2016-09-02 DIAGNOSIS — R252 Cramp and spasm: Secondary | ICD-10-CM | POA: Diagnosis not present

## 2016-09-02 DIAGNOSIS — R1312 Dysphagia, oropharyngeal phase: Secondary | ICD-10-CM | POA: Diagnosis not present

## 2016-09-02 DIAGNOSIS — R590 Localized enlarged lymph nodes: Secondary | ICD-10-CM | POA: Diagnosis not present

## 2016-09-02 DIAGNOSIS — C060 Malignant neoplasm of cheek mucosa: Secondary | ICD-10-CM | POA: Diagnosis not present

## 2016-09-02 DIAGNOSIS — J387 Other diseases of larynx: Secondary | ICD-10-CM | POA: Diagnosis not present

## 2016-09-02 DIAGNOSIS — R220 Localized swelling, mass and lump, head: Secondary | ICD-10-CM | POA: Diagnosis not present

## 2016-09-18 DIAGNOSIS — R938 Abnormal findings on diagnostic imaging of other specified body structures: Secondary | ICD-10-CM | POA: Diagnosis not present

## 2016-09-18 DIAGNOSIS — K5730 Diverticulosis of large intestine without perforation or abscess without bleeding: Secondary | ICD-10-CM | POA: Diagnosis not present

## 2016-09-18 DIAGNOSIS — C060 Malignant neoplasm of cheek mucosa: Secondary | ICD-10-CM | POA: Diagnosis not present

## 2016-09-18 DIAGNOSIS — I700 Atherosclerosis of aorta: Secondary | ICD-10-CM | POA: Diagnosis not present

## 2016-09-18 DIAGNOSIS — R93422 Abnormal radiologic findings on diagnostic imaging of left kidney: Secondary | ICD-10-CM | POA: Diagnosis not present

## 2016-09-18 DIAGNOSIS — Z9889 Other specified postprocedural states: Secondary | ICD-10-CM | POA: Diagnosis not present

## 2016-09-18 DIAGNOSIS — R930 Abnormal findings on diagnostic imaging of skull and head, not elsewhere classified: Secondary | ICD-10-CM | POA: Diagnosis not present

## 2016-09-19 ENCOUNTER — Encounter: Payer: Self-pay | Admitting: *Deleted

## 2016-10-07 NOTE — Progress Notes (Signed)
Patient ID: Christian Ball MRN: [id number], DOB/AGE: March 09, 1951   Date of Visit: 10/09/2016  Primary Physician: Antony Contras, MD Primary Cardiologist:  Babygirl Trager Martinique, MD   History of Present Illness  Christian Ball is a 66 y.o. male with HTN and dyslipidemia who was admitted 12/ 14 with a  NSTEMI. He underwent cardiac cath showing single vessel obstructive CAD involving the second PLOM branch of the RCA, with L-R collaterals. Residual 20-30% mid LAD disease, otherwise normal LCx. Medical therapy was recommended. He was placed on Plavix and Imdur. Peak troponin 4.31. EF was 55-65%. 2D echo was obtained showing basal inferior and inferolateral hypokinesis and mid inferior wall hypokinesis, EF 45-50%, mild MR. Developed hypotension on BB and it was stopped. He also has a history of SSCA of the tongue.   In March 2017 he was evaluated and found to have a mass in the sphenoid region. He had resection of a meningioma on June 07, 2015.   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 According to the patient it was a small mass with clear margins and all lymph nodes negative. Subsequently PET/CT showed an area of concern in the right mandibular area with 2 suspicious lymph nodes. Had needle biopsy yesterday. Planning on RT and chemotherapy.  From a cardiac standpoint he denies any chest pain, SOB, or edema. Energy level is OK. Weight is stable.   Past Medical History Past Medical History:  Diagnosis Date  . CAD (coronary artery disease)    a. 02/2013: NSTEMI -> cath 2nd PLOM branch with L-R collaterals, for medical rx. Consider PCI if refractory sx.  . Dyslipidemia   . Hospital acquired PNA   . Hypertension   . Ischemic cardiomyopathy    a. 02/2013 adm - EF 45-50% by echo, 55-65% by cath.  . Leukocytosis    a. During 03/2103 adm. May need OP recheck.  . Meningioma ([place])   . Nocturnal  oxygen desaturation    a. Instructed to f/u PCP for possible sleep study.  . Situational stress     Past Surgical History None  Allergies/Intolerances Allergies  Allergen Reactions  . Codeine Rash  . Penicillins Rash  . Sulfa Antibiotics Rash    Current Home Medications Current Outpatient Prescriptions  Medication Sig Dispense Refill  . amLODipine (NORVASC) 5 MG tablet Take 5 mg by mouth daily.    Marland Kitchen aspirin EC 81 MG tablet Take 325 mg by mouth daily.     Marland Kitchen atorvastatin (LIPITOR) 20 MG tablet TAKE 1 TABLET (20 MG TOTAL) BY MOUTH DAILY AT 6 PM. 90 tablet 2  . escitalopram (LEXAPRO) 5 MG tablet Take 10 mg by mouth daily.     . isosorbide mononitrate (IMDUR) 30 MG 24 hr tablet TAKE 1 TABLET (30 MG TOTAL) BY MOUTH DAILY. 90 tablet 3  . lisinopril (PRINIVIL,ZESTRIL) 20 MG tablet Take 20 mg by mouth daily.    . Multiple Vitamin (MULTIVITAMIN WITH MINERALS) TABS tablet Take 1 tablet by mouth daily.    . nitroGLYCERIN (NITROSTAT) 0.4 MG SL tablet Place 1 tablet (0.4 mg total) under the tongue every 5 (five) minutes as needed for chest pain (up to 3 doses). 25 tablet 3   No current facility-administered medications for this visit.     Social History Social History   Social History  . Marital status: Married    Spouse name: N/A  . Number  of children: N/A  . Years of education: N/A   Occupational History  . Not on file.   Social History Main Topics  . Smoking status: Never Smoker  . Smokeless tobacco: Never Used     Comment: has had occasional cigar.  . Alcohol use Yes     Comment: 2 drinks/year  . Drug use: No  . Sexual activity: Yes   Other Topics Concern  . Not on file   Social History Narrative   Lives in [place] with his wife.  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  Not currently exercising on routine basis.     Review of Systems As noted in HPI. All other systems reviewed and are otherwise negative except as noted above.  Physical Exam Vitals: Blood pressure 136/88,  pulse 65, height 5\' 8"  (1.727 m), weight 187 lb 9.6 oz (85.1 kg).  General: Well developed, well appearing 66 y.o. male in no acute distress. HEENT: Normocephalic.  EOMs intact. Sclera nonicteric. Oropharynx clear. S/p neck dissection.  Neck: Supple without bruits. No JVD. Lungs: Respirations regular and unlabored, CTA bilaterally. No wheezes, rales or rhonchi. Heart: RRR. S1, S2 present. No murmurs, rub, S3 or S4. Abdomen: Soft, non-distended.  Extremities: No clubbing, cyanosis or edema. PT/Radials 2+ and equal bilaterally. Right arm skin graft site is healing. Psych: Normal affect. Neuro: Alert and oriented X 3. Moves all extremities spontaneously.   Laboratory data:  Lab Results  Component Value Date   WBC 12.1 (H) 03/29/2013   HGB 14.6 02/13/2014   HCT 34.4 (L) 03/29/2013   PLT 238 03/29/2013   GLUCOSE 84 02/08/2014   CHOL 146 03/28/2013   TRIG 138 03/28/2013   HDL 38 (L) 03/28/2013   LDLCALC 80 03/28/2013   ALT 23 03/27/2013   AST 30 03/27/2013   NA 138 02/08/2014   K 3.6 (L) 02/08/2014   CL 95 (L) 02/08/2014   CREATININE 1.08 02/08/2014   BUN 17 02/08/2014   CO2 30 02/08/2014   TSH 1.239 03/27/2013   INR 1.00 03/29/2013   HGBA1C 5.6 03/27/2013   Labs dated 01/07/16: cholesterol 106, triglycerides 79, LDL 51, HDL 39. CMET, TSH normal. Hgb 14.5 Dated 02/28/16: normal BMET, WBC 13.2. Hgb 9.7 Dated 09/02/16: creatinine1.1. Dated 07/14/16: cholesterol 104, triglycerides 82, HDL 44, LDL 44. CMET normal.  Ecg today shows NSR with LAD, ? Incomplete RBBB. I have personally reviewed and interpreted this study.   Assessment and Plan 1.  CAD s/p  NSTEMI 03/14/13, currently asymptomatic. - 2nd PLOM branch 90% with L-R collaterals, medical management recommended - continue medical therapy with ASA and Imdur.  -Continue dietary and exercise modifications.  2.  HTN - BP well controlled today. - continue current regimen  3. Dyslipidemia- excellent control. - continue statin    4. SSCA of the tongue s/p resection. Now with recurrence. S/p modified radical neck resection in October. Awaiting repeat biopsy results. Planning aggressive RT and chemotherapy.  5. S/p resection of meningioma  I will follow up in 6 months.

## 2016-10-08 DIAGNOSIS — C060 Malignant neoplasm of cheek mucosa: Secondary | ICD-10-CM | POA: Diagnosis not present

## 2016-10-08 DIAGNOSIS — R590 Localized enlarged lymph nodes: Secondary | ICD-10-CM | POA: Diagnosis not present

## 2016-10-09 ENCOUNTER — Ambulatory Visit (INDEPENDENT_AMBULATORY_CARE_PROVIDER_SITE_OTHER): Payer: Medicare Other | Admitting: Cardiology

## 2016-10-09 ENCOUNTER — Encounter: Payer: Self-pay | Admitting: Cardiology

## 2016-10-09 VITALS — BP 136/88 | HR 65 | Ht 68.0 in | Wt 187.6 lb

## 2016-10-09 DIAGNOSIS — I10 Essential (primary) hypertension: Secondary | ICD-10-CM | POA: Diagnosis not present

## 2016-10-09 DIAGNOSIS — E785 Hyperlipidemia, unspecified: Secondary | ICD-10-CM | POA: Diagnosis not present

## 2016-10-09 DIAGNOSIS — I2510 Atherosclerotic heart disease of native coronary artery without angina pectoris: Secondary | ICD-10-CM | POA: Diagnosis not present

## 2016-10-09 NOTE — Patient Instructions (Signed)
Continue your current therapy  Good luck with your cancer treatment.  I will see you in 6 months.

## 2016-10-10 DIAGNOSIS — C060 Malignant neoplasm of cheek mucosa: Secondary | ICD-10-CM | POA: Diagnosis not present

## 2016-10-10 NOTE — Addendum Note (Signed)
Addended by: Zebedee Iba on: 10/10/2016 11:03 AM   Modules accepted: Orders

## 2016-10-12 ENCOUNTER — Other Ambulatory Visit: Payer: Self-pay | Admitting: Cardiology

## 2016-10-13 ENCOUNTER — Telehealth: Payer: Self-pay | Admitting: Cardiology

## 2016-10-13 ENCOUNTER — Other Ambulatory Visit: Payer: Self-pay

## 2016-10-13 DIAGNOSIS — C060 Malignant neoplasm of cheek mucosa: Secondary | ICD-10-CM | POA: Diagnosis not present

## 2016-10-13 MED ORDER — ISOSORBIDE MONONITRATE ER 30 MG PO TB24: 30.0000 mg | ORAL_TABLET | Freq: Every day | ORAL | 2 refills | Status: AC

## 2016-10-13 MED ORDER — ATORVASTATIN CALCIUM 20 MG PO TABS: 20.0000 mg | ORAL_TABLET | Freq: Every day | ORAL | 3 refills | Status: AC

## 2016-10-13 NOTE — Telephone Encounter (Signed)
New message     *STAT* If patient is at the pharmacy, call can be transferred to refill team.   1. Which medications need to be refilled? (please list name of each medication and dose if known) imdur 30 mg  2. Which pharmacy/location (including street and city if local pharmacy) is medication to be sent to? [place]   3. Do they need a 30 day or 90 day supply? 90 day

## 2016-10-13 NOTE — Telephone Encounter (Signed)
°*  STAT* If patient is at the pharmacy, call can be transferred to refill team.   1. Which medications need to be refilled? (please list name of each medication and dose if known) Pt is at the pharmacy-waiting to go out of town-Atorvastatin  2. Which pharmacy/location (including street and city if local pharmacy) is medication to be sent to?[facility]-[phone]  3. Do they need a 30 day or 90 day supply?90 and refills

## 2016-10-16 DIAGNOSIS — L409 Psoriasis, unspecified: Secondary | ICD-10-CM | POA: Diagnosis not present

## 2016-10-16 DIAGNOSIS — I10 Essential (primary) hypertension: Secondary | ICD-10-CM | POA: Diagnosis not present

## 2016-10-16 DIAGNOSIS — Z880 Allergy status to penicillin: Secondary | ICD-10-CM | POA: Diagnosis not present

## 2016-10-16 DIAGNOSIS — R252 Cramp and spasm: Secondary | ICD-10-CM | POA: Diagnosis not present

## 2016-10-16 DIAGNOSIS — Z882 Allergy status to sulfonamides status: Secondary | ICD-10-CM | POA: Diagnosis not present

## 2016-10-16 DIAGNOSIS — Z79899 Other long term (current) drug therapy: Secondary | ICD-10-CM | POA: Diagnosis not present

## 2016-10-16 DIAGNOSIS — I77819 Aortic ectasia, unspecified site: Secondary | ICD-10-CM | POA: Diagnosis not present

## 2016-10-16 DIAGNOSIS — Z91849 Unspecified risk for dental caries: Secondary | ICD-10-CM | POA: Diagnosis not present

## 2016-10-16 DIAGNOSIS — C060 Malignant neoplasm of cheek mucosa: Secondary | ICD-10-CM | POA: Diagnosis not present

## 2016-10-16 DIAGNOSIS — I252 Old myocardial infarction: Secondary | ICD-10-CM | POA: Diagnosis not present

## 2016-10-16 DIAGNOSIS — Z7982 Long term (current) use of aspirin: Secondary | ICD-10-CM | POA: Diagnosis not present

## 2016-10-16 DIAGNOSIS — Z9009 Acquired absence of other part of head and neck: Secondary | ICD-10-CM | POA: Diagnosis not present

## 2016-10-16 DIAGNOSIS — Z85828 Personal history of other malignant neoplasm of skin: Secondary | ICD-10-CM | POA: Diagnosis not present

## 2016-10-16 DIAGNOSIS — Z8619 Personal history of other infectious and parasitic diseases: Secondary | ICD-10-CM | POA: Diagnosis not present

## 2016-10-17 DIAGNOSIS — K08499 Partial loss of teeth due to other specified cause, unspecified class: Secondary | ICD-10-CM | POA: Diagnosis not present

## 2016-10-17 DIAGNOSIS — Z91849 Unspecified risk for dental caries: Secondary | ICD-10-CM | POA: Diagnosis not present

## 2016-10-17 DIAGNOSIS — I10 Essential (primary) hypertension: Secondary | ICD-10-CM | POA: Diagnosis not present

## 2016-10-17 DIAGNOSIS — Z0389 Encounter for observation for other suspected diseases and conditions ruled out: Secondary | ICD-10-CM | POA: Diagnosis not present

## 2016-10-17 DIAGNOSIS — C062 Malignant neoplasm of retromolar area: Secondary | ICD-10-CM | POA: Diagnosis not present

## 2016-10-17 DIAGNOSIS — I77810 Thoracic aortic ectasia: Secondary | ICD-10-CM | POA: Diagnosis not present

## 2016-10-17 DIAGNOSIS — C060 Malignant neoplasm of cheek mucosa: Secondary | ICD-10-CM | POA: Diagnosis not present

## 2016-10-17 DIAGNOSIS — I77819 Aortic ectasia, unspecified site: Secondary | ICD-10-CM | POA: Diagnosis not present

## 2016-10-17 DIAGNOSIS — R252 Cramp and spasm: Secondary | ICD-10-CM | POA: Diagnosis not present

## 2016-10-17 DIAGNOSIS — I252 Old myocardial infarction: Secondary | ICD-10-CM | POA: Diagnosis not present

## 2016-10-17 DIAGNOSIS — M269 Dentofacial anomaly, unspecified: Secondary | ICD-10-CM | POA: Diagnosis not present

## 2016-10-20 DIAGNOSIS — C0689 Malignant neoplasm of overlapping sites of other parts of mouth: Secondary | ICD-10-CM | POA: Diagnosis not present

## 2016-10-21 DIAGNOSIS — R252 Cramp and spasm: Secondary | ICD-10-CM | POA: Diagnosis not present

## 2016-10-21 DIAGNOSIS — C060 Malignant neoplasm of cheek mucosa: Secondary | ICD-10-CM | POA: Diagnosis not present

## 2016-10-21 DIAGNOSIS — I10 Essential (primary) hypertension: Secondary | ICD-10-CM | POA: Diagnosis not present

## 2016-10-30 DIAGNOSIS — C060 Malignant neoplasm of cheek mucosa: Secondary | ICD-10-CM | POA: Diagnosis not present

## 2016-10-30 DIAGNOSIS — Z7982 Long term (current) use of aspirin: Secondary | ICD-10-CM | POA: Diagnosis not present

## 2016-10-30 DIAGNOSIS — I10 Essential (primary) hypertension: Secondary | ICD-10-CM | POA: Diagnosis not present

## 2016-10-30 DIAGNOSIS — Z881 Allergy status to other antibiotic agents status: Secondary | ICD-10-CM | POA: Diagnosis not present

## 2016-10-30 DIAGNOSIS — Z9889 Other specified postprocedural states: Secondary | ICD-10-CM | POA: Diagnosis not present

## 2016-10-30 DIAGNOSIS — Z79899 Other long term (current) drug therapy: Secondary | ICD-10-CM | POA: Diagnosis not present

## 2016-10-30 DIAGNOSIS — Z85828 Personal history of other malignant neoplasm of skin: Secondary | ICD-10-CM | POA: Diagnosis not present

## 2016-10-30 DIAGNOSIS — Z885 Allergy status to narcotic agent status: Secondary | ICD-10-CM | POA: Diagnosis not present

## 2016-10-30 DIAGNOSIS — Z01810 Encounter for preprocedural cardiovascular examination: Secondary | ICD-10-CM | POA: Diagnosis not present

## 2016-10-30 DIAGNOSIS — C069 Malignant neoplasm of mouth, unspecified: Secondary | ICD-10-CM | POA: Diagnosis not present

## 2016-10-30 DIAGNOSIS — R9431 Abnormal electrocardiogram [ECG] [EKG]: Secondary | ICD-10-CM | POA: Diagnosis not present

## 2016-10-30 DIAGNOSIS — I252 Old myocardial infarction: Secondary | ICD-10-CM | POA: Diagnosis not present

## 2016-10-30 DIAGNOSIS — Z880 Allergy status to penicillin: Secondary | ICD-10-CM | POA: Diagnosis not present

## 2016-10-31 DIAGNOSIS — C069 Malignant neoplasm of mouth, unspecified: Secondary | ICD-10-CM | POA: Diagnosis not present

## 2016-11-10 DIAGNOSIS — C060 Malignant neoplasm of cheek mucosa: Secondary | ICD-10-CM | POA: Diagnosis not present

## 2016-11-10 DIAGNOSIS — D320 Benign neoplasm of cerebral meninges: Secondary | ICD-10-CM | POA: Diagnosis not present

## 2016-11-10 DIAGNOSIS — D329 Benign neoplasm of meninges, unspecified: Secondary | ICD-10-CM | POA: Diagnosis not present

## 2016-11-10 DIAGNOSIS — Z510 Encounter for antineoplastic radiation therapy: Secondary | ICD-10-CM | POA: Diagnosis not present

## 2016-11-10 DIAGNOSIS — C700 Malignant neoplasm of cerebral meninges: Secondary | ICD-10-CM | POA: Diagnosis not present

## 2016-11-13 DIAGNOSIS — C060 Malignant neoplasm of cheek mucosa: Secondary | ICD-10-CM | POA: Diagnosis not present

## 2016-11-13 DIAGNOSIS — D320 Benign neoplasm of cerebral meninges: Secondary | ICD-10-CM | POA: Diagnosis not present

## 2016-11-13 DIAGNOSIS — C700 Malignant neoplasm of cerebral meninges: Secondary | ICD-10-CM | POA: Diagnosis not present

## 2016-11-13 DIAGNOSIS — Z510 Encounter for antineoplastic radiation therapy: Secondary | ICD-10-CM | POA: Diagnosis not present

## 2016-11-17 DIAGNOSIS — W57XXXA Bitten or stung by nonvenomous insect and other nonvenomous arthropods, initial encounter: Secondary | ICD-10-CM | POA: Diagnosis not present

## 2016-11-17 DIAGNOSIS — J069 Acute upper respiratory infection, unspecified: Secondary | ICD-10-CM | POA: Diagnosis not present

## 2016-11-17 DIAGNOSIS — S30861A Insect bite (nonvenomous) of abdominal wall, initial encounter: Secondary | ICD-10-CM | POA: Diagnosis not present

## 2016-11-20 DIAGNOSIS — R7989 Other specified abnormal findings of blood chemistry: Secondary | ICD-10-CM | POA: Diagnosis not present

## 2016-11-20 DIAGNOSIS — C700 Malignant neoplasm of cerebral meninges: Secondary | ICD-10-CM | POA: Diagnosis not present

## 2016-11-20 DIAGNOSIS — D320 Benign neoplasm of cerebral meninges: Secondary | ICD-10-CM | POA: Diagnosis not present

## 2016-11-20 DIAGNOSIS — C060 Malignant neoplasm of cheek mucosa: Secondary | ICD-10-CM | POA: Diagnosis not present

## 2016-11-20 DIAGNOSIS — D329 Benign neoplasm of meninges, unspecified: Secondary | ICD-10-CM | POA: Diagnosis not present

## 2016-11-20 DIAGNOSIS — Z510 Encounter for antineoplastic radiation therapy: Secondary | ICD-10-CM | POA: Diagnosis not present

## 2016-11-21 DIAGNOSIS — C700 Malignant neoplasm of cerebral meninges: Secondary | ICD-10-CM | POA: Diagnosis not present

## 2016-11-21 DIAGNOSIS — D320 Benign neoplasm of cerebral meninges: Secondary | ICD-10-CM | POA: Diagnosis not present

## 2016-11-21 DIAGNOSIS — D329 Benign neoplasm of meninges, unspecified: Secondary | ICD-10-CM | POA: Diagnosis not present

## 2016-11-21 DIAGNOSIS — Z510 Encounter for antineoplastic radiation therapy: Secondary | ICD-10-CM | POA: Diagnosis not present

## 2016-11-21 DIAGNOSIS — Z5111 Encounter for antineoplastic chemotherapy: Secondary | ICD-10-CM | POA: Diagnosis not present

## 2016-11-21 DIAGNOSIS — C060 Malignant neoplasm of cheek mucosa: Secondary | ICD-10-CM | POA: Diagnosis not present

## 2016-11-22 DIAGNOSIS — I499 Cardiac arrhythmia, unspecified: Secondary | ICD-10-CM | POA: Diagnosis not present

## 2016-11-22 DIAGNOSIS — C760 Malignant neoplasm of head, face and neck: Secondary | ICD-10-CM | POA: Diagnosis not present

## 2016-11-22 DIAGNOSIS — Z9221 Personal history of antineoplastic chemotherapy: Secondary | ICD-10-CM | POA: Diagnosis not present

## 2016-11-22 DIAGNOSIS — I252 Old myocardial infarction: Secondary | ICD-10-CM | POA: Diagnosis not present

## 2016-11-22 DIAGNOSIS — Z7982 Long term (current) use of aspirin: Secondary | ICD-10-CM | POA: Diagnosis not present

## 2016-11-22 DIAGNOSIS — C060 Malignant neoplasm of cheek mucosa: Secondary | ICD-10-CM | POA: Diagnosis not present

## 2016-11-22 DIAGNOSIS — Z79899 Other long term (current) drug therapy: Secondary | ICD-10-CM | POA: Diagnosis not present

## 2016-11-22 DIAGNOSIS — Z885 Allergy status to narcotic agent status: Secondary | ICD-10-CM | POA: Diagnosis not present

## 2016-11-22 DIAGNOSIS — I2510 Atherosclerotic heart disease of native coronary artery without angina pectoris: Secondary | ICD-10-CM | POA: Diagnosis not present

## 2016-11-22 DIAGNOSIS — E785 Hyperlipidemia, unspecified: Secondary | ICD-10-CM | POA: Diagnosis not present

## 2016-11-22 DIAGNOSIS — Z882 Allergy status to sulfonamides status: Secondary | ICD-10-CM | POA: Diagnosis not present

## 2016-11-22 DIAGNOSIS — I10 Essential (primary) hypertension: Secondary | ICD-10-CM | POA: Diagnosis not present

## 2016-11-22 DIAGNOSIS — I959 Hypotension, unspecified: Secondary | ICD-10-CM | POA: Diagnosis not present

## 2016-11-22 DIAGNOSIS — Z880 Allergy status to penicillin: Secondary | ICD-10-CM | POA: Diagnosis not present

## 2016-11-24 DIAGNOSIS — C060 Malignant neoplasm of cheek mucosa: Secondary | ICD-10-CM | POA: Diagnosis not present

## 2016-11-24 DIAGNOSIS — Z510 Encounter for antineoplastic radiation therapy: Secondary | ICD-10-CM | POA: Diagnosis not present

## 2016-11-24 DIAGNOSIS — D329 Benign neoplasm of meninges, unspecified: Secondary | ICD-10-CM | POA: Diagnosis not present

## 2016-11-24 DIAGNOSIS — D320 Benign neoplasm of cerebral meninges: Secondary | ICD-10-CM | POA: Diagnosis not present

## 2016-11-24 DIAGNOSIS — C700 Malignant neoplasm of cerebral meninges: Secondary | ICD-10-CM | POA: Diagnosis not present

## 2016-11-25 ENCOUNTER — Telehealth: Payer: Self-pay | Admitting: Cardiology

## 2016-11-25 DIAGNOSIS — C700 Malignant neoplasm of cerebral meninges: Secondary | ICD-10-CM | POA: Diagnosis not present

## 2016-11-25 DIAGNOSIS — D329 Benign neoplasm of meninges, unspecified: Secondary | ICD-10-CM | POA: Diagnosis not present

## 2016-11-25 DIAGNOSIS — D320 Benign neoplasm of cerebral meninges: Secondary | ICD-10-CM | POA: Diagnosis not present

## 2016-11-25 DIAGNOSIS — Z510 Encounter for antineoplastic radiation therapy: Secondary | ICD-10-CM | POA: Diagnosis not present

## 2016-11-25 DIAGNOSIS — C060 Malignant neoplasm of cheek mucosa: Secondary | ICD-10-CM | POA: Diagnosis not present

## 2016-11-25 NOTE — Telephone Encounter (Signed)
Pt's wife called to update this office of this pt doing Cemo and BP  [place].     [place]. would like pt to stop his morning BP.    Wife has questions about pt's medication.     Please give her a call.

## 2016-11-25 NOTE — Telephone Encounter (Signed)
[id number] 

## 2016-11-26 DIAGNOSIS — C700 Malignant neoplasm of cerebral meninges: Secondary | ICD-10-CM | POA: Diagnosis not present

## 2016-11-26 DIAGNOSIS — C760 Malignant neoplasm of head, face and neck: Secondary | ICD-10-CM | POA: Diagnosis not present

## 2016-11-26 DIAGNOSIS — Z510 Encounter for antineoplastic radiation therapy: Secondary | ICD-10-CM | POA: Diagnosis not present

## 2016-11-26 DIAGNOSIS — D329 Benign neoplasm of meninges, unspecified: Secondary | ICD-10-CM | POA: Diagnosis not present

## 2016-11-26 DIAGNOSIS — D320 Benign neoplasm of cerebral meninges: Secondary | ICD-10-CM | POA: Diagnosis not present

## 2016-11-26 DIAGNOSIS — C060 Malignant neoplasm of cheek mucosa: Secondary | ICD-10-CM | POA: Diagnosis not present

## 2016-11-26 NOTE — Telephone Encounter (Signed)
F/u Message   Returning RN call. Please call back to discuss

## 2016-11-26 NOTE — Telephone Encounter (Signed)
Yes he should hold lisinopril HCT.   Peter Martinique MD, [facility]

## 2016-11-26 NOTE — Telephone Encounter (Signed)
Left message on personal voice mail Dr.Jordan advised ok to hold lisinopril/hctz.

## 2016-11-26 NOTE — Telephone Encounter (Signed)
Returned call to patient's wife.She stated husband is taking chemo and radiation.He went to [facility] this past Sat to receive fluids B/P low 70/43.Dr.advised to hold lisinopril/hctz.Patient has been taking lisinopril/hctz 20/25 mg daily.She wanted to make sure Dr.Jordan ok with holding.Message sent to [place].

## 2016-11-26 NOTE — Telephone Encounter (Signed)
Returned call to patient's wife.Left message on personal voice mail Dr

## 2016-11-27 DIAGNOSIS — E876 Hypokalemia: Secondary | ICD-10-CM | POA: Diagnosis not present

## 2016-11-27 DIAGNOSIS — Z9221 Personal history of antineoplastic chemotherapy: Secondary | ICD-10-CM | POA: Diagnosis not present

## 2016-11-27 DIAGNOSIS — I2510 Atherosclerotic heart disease of native coronary artery without angina pectoris: Secondary | ICD-10-CM | POA: Diagnosis not present

## 2016-11-27 DIAGNOSIS — E785 Hyperlipidemia, unspecified: Secondary | ICD-10-CM | POA: Diagnosis not present

## 2016-11-27 DIAGNOSIS — Z9089 Acquired absence of other organs: Secondary | ICD-10-CM | POA: Diagnosis not present

## 2016-11-27 DIAGNOSIS — I10 Essential (primary) hypertension: Secondary | ICD-10-CM | POA: Diagnosis not present

## 2016-11-27 DIAGNOSIS — Z7982 Long term (current) use of aspirin: Secondary | ICD-10-CM | POA: Diagnosis not present

## 2016-11-27 DIAGNOSIS — C060 Malignant neoplasm of cheek mucosa: Secondary | ICD-10-CM | POA: Diagnosis not present

## 2016-11-27 DIAGNOSIS — I252 Old myocardial infarction: Secondary | ICD-10-CM | POA: Diagnosis not present

## 2016-11-27 DIAGNOSIS — Z880 Allergy status to penicillin: Secondary | ICD-10-CM | POA: Diagnosis not present

## 2016-11-27 DIAGNOSIS — C700 Malignant neoplasm of cerebral meninges: Secondary | ICD-10-CM | POA: Diagnosis not present

## 2016-11-27 DIAGNOSIS — Z9889 Other specified postprocedural states: Secondary | ICD-10-CM | POA: Diagnosis not present

## 2016-11-27 DIAGNOSIS — D329 Benign neoplasm of meninges, unspecified: Secondary | ICD-10-CM | POA: Diagnosis not present

## 2016-11-27 DIAGNOSIS — Z713 Dietary counseling and surveillance: Secondary | ICD-10-CM | POA: Diagnosis not present

## 2016-11-27 DIAGNOSIS — Z882 Allergy status to sulfonamides status: Secondary | ICD-10-CM | POA: Diagnosis not present

## 2016-11-27 DIAGNOSIS — Z79899 Other long term (current) drug therapy: Secondary | ICD-10-CM | POA: Diagnosis not present

## 2016-11-27 DIAGNOSIS — J00 Acute nasopharyngitis [common cold]: Secondary | ICD-10-CM | POA: Diagnosis not present

## 2016-11-27 DIAGNOSIS — Z885 Allergy status to narcotic agent status: Secondary | ICD-10-CM | POA: Diagnosis not present

## 2016-11-27 DIAGNOSIS — Z510 Encounter for antineoplastic radiation therapy: Secondary | ICD-10-CM | POA: Diagnosis not present

## 2016-11-28 DIAGNOSIS — Z510 Encounter for antineoplastic radiation therapy: Secondary | ICD-10-CM | POA: Diagnosis not present

## 2016-11-28 DIAGNOSIS — D320 Benign neoplasm of cerebral meninges: Secondary | ICD-10-CM | POA: Diagnosis not present

## 2016-11-28 DIAGNOSIS — C700 Malignant neoplasm of cerebral meninges: Secondary | ICD-10-CM | POA: Diagnosis not present

## 2016-11-28 DIAGNOSIS — C060 Malignant neoplasm of cheek mucosa: Secondary | ICD-10-CM | POA: Diagnosis not present

## 2016-11-28 DIAGNOSIS — D329 Benign neoplasm of meninges, unspecified: Secondary | ICD-10-CM | POA: Diagnosis not present

## 2016-11-29 DIAGNOSIS — C060 Malignant neoplasm of cheek mucosa: Secondary | ICD-10-CM | POA: Diagnosis not present

## 2016-11-29 DIAGNOSIS — C760 Malignant neoplasm of head, face and neck: Secondary | ICD-10-CM | POA: Diagnosis not present

## 2016-12-02 DIAGNOSIS — C700 Malignant neoplasm of cerebral meninges: Secondary | ICD-10-CM | POA: Diagnosis not present

## 2016-12-02 DIAGNOSIS — D329 Benign neoplasm of meninges, unspecified: Secondary | ICD-10-CM | POA: Diagnosis not present

## 2016-12-02 DIAGNOSIS — C770 Secondary and unspecified malignant neoplasm of lymph nodes of head, face and neck: Secondary | ICD-10-CM | POA: Diagnosis not present

## 2016-12-02 DIAGNOSIS — C060 Malignant neoplasm of cheek mucosa: Secondary | ICD-10-CM | POA: Diagnosis not present

## 2016-12-02 DIAGNOSIS — Z510 Encounter for antineoplastic radiation therapy: Secondary | ICD-10-CM | POA: Diagnosis not present

## 2016-12-02 DIAGNOSIS — D320 Benign neoplasm of cerebral meninges: Secondary | ICD-10-CM | POA: Diagnosis not present

## 2016-12-02 DIAGNOSIS — Z79899 Other long term (current) drug therapy: Secondary | ICD-10-CM | POA: Diagnosis not present

## 2016-12-03 DIAGNOSIS — Z79899 Other long term (current) drug therapy: Secondary | ICD-10-CM | POA: Diagnosis not present

## 2016-12-03 DIAGNOSIS — C700 Malignant neoplasm of cerebral meninges: Secondary | ICD-10-CM | POA: Diagnosis not present

## 2016-12-03 DIAGNOSIS — Z510 Encounter for antineoplastic radiation therapy: Secondary | ICD-10-CM | POA: Diagnosis not present

## 2016-12-03 DIAGNOSIS — D320 Benign neoplasm of cerebral meninges: Secondary | ICD-10-CM | POA: Diagnosis not present

## 2016-12-03 DIAGNOSIS — C770 Secondary and unspecified malignant neoplasm of lymph nodes of head, face and neck: Secondary | ICD-10-CM | POA: Diagnosis not present

## 2016-12-03 DIAGNOSIS — D329 Benign neoplasm of meninges, unspecified: Secondary | ICD-10-CM | POA: Diagnosis not present

## 2016-12-03 DIAGNOSIS — C060 Malignant neoplasm of cheek mucosa: Secondary | ICD-10-CM | POA: Diagnosis not present

## 2016-12-04 DIAGNOSIS — K219 Gastro-esophageal reflux disease without esophagitis: Secondary | ICD-10-CM | POA: Diagnosis not present

## 2016-12-04 DIAGNOSIS — C060 Malignant neoplasm of cheek mucosa: Secondary | ICD-10-CM | POA: Diagnosis not present

## 2016-12-04 DIAGNOSIS — C700 Malignant neoplasm of cerebral meninges: Secondary | ICD-10-CM | POA: Diagnosis not present

## 2016-12-04 DIAGNOSIS — D329 Benign neoplasm of meninges, unspecified: Secondary | ICD-10-CM | POA: Diagnosis not present

## 2016-12-04 DIAGNOSIS — E860 Dehydration: Secondary | ICD-10-CM | POA: Diagnosis not present

## 2016-12-04 DIAGNOSIS — Z510 Encounter for antineoplastic radiation therapy: Secondary | ICD-10-CM | POA: Diagnosis not present

## 2016-12-04 DIAGNOSIS — D320 Benign neoplasm of cerebral meninges: Secondary | ICD-10-CM | POA: Diagnosis not present

## 2016-12-05 DIAGNOSIS — D320 Benign neoplasm of cerebral meninges: Secondary | ICD-10-CM | POA: Diagnosis not present

## 2016-12-05 DIAGNOSIS — C700 Malignant neoplasm of cerebral meninges: Secondary | ICD-10-CM | POA: Diagnosis not present

## 2016-12-05 DIAGNOSIS — C060 Malignant neoplasm of cheek mucosa: Secondary | ICD-10-CM | POA: Diagnosis not present

## 2016-12-05 DIAGNOSIS — R1312 Dysphagia, oropharyngeal phase: Secondary | ICD-10-CM | POA: Diagnosis not present

## 2016-12-05 DIAGNOSIS — D329 Benign neoplasm of meninges, unspecified: Secondary | ICD-10-CM | POA: Diagnosis not present

## 2016-12-05 DIAGNOSIS — Z510 Encounter for antineoplastic radiation therapy: Secondary | ICD-10-CM | POA: Diagnosis not present

## 2016-12-08 DIAGNOSIS — C060 Malignant neoplasm of cheek mucosa: Secondary | ICD-10-CM | POA: Diagnosis not present

## 2016-12-08 DIAGNOSIS — Z510 Encounter for antineoplastic radiation therapy: Secondary | ICD-10-CM | POA: Diagnosis not present

## 2016-12-08 DIAGNOSIS — D320 Benign neoplasm of cerebral meninges: Secondary | ICD-10-CM | POA: Diagnosis not present

## 2016-12-08 DIAGNOSIS — D329 Benign neoplasm of meninges, unspecified: Secondary | ICD-10-CM | POA: Diagnosis not present

## 2016-12-08 DIAGNOSIS — C700 Malignant neoplasm of cerebral meninges: Secondary | ICD-10-CM | POA: Diagnosis not present

## 2016-12-09 DIAGNOSIS — R1312 Dysphagia, oropharyngeal phase: Secondary | ICD-10-CM | POA: Diagnosis not present

## 2016-12-09 DIAGNOSIS — C700 Malignant neoplasm of cerebral meninges: Secondary | ICD-10-CM | POA: Diagnosis not present

## 2016-12-09 DIAGNOSIS — Z510 Encounter for antineoplastic radiation therapy: Secondary | ICD-10-CM | POA: Diagnosis not present

## 2016-12-09 DIAGNOSIS — D320 Benign neoplasm of cerebral meninges: Secondary | ICD-10-CM | POA: Diagnosis not present

## 2016-12-09 DIAGNOSIS — D329 Benign neoplasm of meninges, unspecified: Secondary | ICD-10-CM | POA: Diagnosis not present

## 2016-12-09 DIAGNOSIS — C060 Malignant neoplasm of cheek mucosa: Secondary | ICD-10-CM | POA: Diagnosis not present

## 2016-12-10 DIAGNOSIS — D329 Benign neoplasm of meninges, unspecified: Secondary | ICD-10-CM | POA: Diagnosis not present

## 2016-12-10 DIAGNOSIS — Z510 Encounter for antineoplastic radiation therapy: Secondary | ICD-10-CM | POA: Diagnosis not present

## 2016-12-10 DIAGNOSIS — D320 Benign neoplasm of cerebral meninges: Secondary | ICD-10-CM | POA: Diagnosis not present

## 2016-12-10 DIAGNOSIS — C700 Malignant neoplasm of cerebral meninges: Secondary | ICD-10-CM | POA: Diagnosis not present

## 2016-12-10 DIAGNOSIS — C060 Malignant neoplasm of cheek mucosa: Secondary | ICD-10-CM | POA: Diagnosis not present

## 2016-12-11 DIAGNOSIS — Z510 Encounter for antineoplastic radiation therapy: Secondary | ICD-10-CM | POA: Diagnosis not present

## 2016-12-11 DIAGNOSIS — D320 Benign neoplasm of cerebral meninges: Secondary | ICD-10-CM | POA: Diagnosis not present

## 2016-12-11 DIAGNOSIS — C760 Malignant neoplasm of head, face and neck: Secondary | ICD-10-CM | POA: Diagnosis not present

## 2016-12-11 DIAGNOSIS — D329 Benign neoplasm of meninges, unspecified: Secondary | ICD-10-CM | POA: Diagnosis not present

## 2016-12-11 DIAGNOSIS — R7989 Other specified abnormal findings of blood chemistry: Secondary | ICD-10-CM | POA: Diagnosis not present

## 2016-12-11 DIAGNOSIS — C700 Malignant neoplasm of cerebral meninges: Secondary | ICD-10-CM | POA: Diagnosis not present

## 2016-12-11 DIAGNOSIS — C060 Malignant neoplasm of cheek mucosa: Secondary | ICD-10-CM | POA: Diagnosis not present

## 2016-12-12 DIAGNOSIS — C060 Malignant neoplasm of cheek mucosa: Secondary | ICD-10-CM | POA: Diagnosis not present

## 2016-12-12 DIAGNOSIS — Z510 Encounter for antineoplastic radiation therapy: Secondary | ICD-10-CM | POA: Diagnosis not present

## 2016-12-12 DIAGNOSIS — D329 Benign neoplasm of meninges, unspecified: Secondary | ICD-10-CM | POA: Diagnosis not present

## 2016-12-12 DIAGNOSIS — C700 Malignant neoplasm of cerebral meninges: Secondary | ICD-10-CM | POA: Diagnosis not present

## 2016-12-12 DIAGNOSIS — Z5111 Encounter for antineoplastic chemotherapy: Secondary | ICD-10-CM | POA: Diagnosis not present

## 2016-12-12 DIAGNOSIS — D320 Benign neoplasm of cerebral meninges: Secondary | ICD-10-CM | POA: Diagnosis not present

## 2016-12-13 DIAGNOSIS — C060 Malignant neoplasm of cheek mucosa: Secondary | ICD-10-CM | POA: Diagnosis not present

## 2016-12-15 DIAGNOSIS — C700 Malignant neoplasm of cerebral meninges: Secondary | ICD-10-CM | POA: Diagnosis not present

## 2016-12-15 DIAGNOSIS — D320 Benign neoplasm of cerebral meninges: Secondary | ICD-10-CM | POA: Diagnosis not present

## 2016-12-15 DIAGNOSIS — D329 Benign neoplasm of meninges, unspecified: Secondary | ICD-10-CM | POA: Diagnosis not present

## 2016-12-15 DIAGNOSIS — C060 Malignant neoplasm of cheek mucosa: Secondary | ICD-10-CM | POA: Diagnosis not present

## 2016-12-15 DIAGNOSIS — Z510 Encounter for antineoplastic radiation therapy: Secondary | ICD-10-CM | POA: Diagnosis not present

## 2016-12-16 DIAGNOSIS — C060 Malignant neoplasm of cheek mucosa: Secondary | ICD-10-CM | POA: Diagnosis not present

## 2016-12-16 DIAGNOSIS — D329 Benign neoplasm of meninges, unspecified: Secondary | ICD-10-CM | POA: Diagnosis not present

## 2016-12-16 DIAGNOSIS — Z510 Encounter for antineoplastic radiation therapy: Secondary | ICD-10-CM | POA: Diagnosis not present

## 2016-12-16 DIAGNOSIS — C700 Malignant neoplasm of cerebral meninges: Secondary | ICD-10-CM | POA: Diagnosis not present

## 2016-12-16 DIAGNOSIS — D320 Benign neoplasm of cerebral meninges: Secondary | ICD-10-CM | POA: Diagnosis not present

## 2016-12-17 DIAGNOSIS — D329 Benign neoplasm of meninges, unspecified: Secondary | ICD-10-CM | POA: Diagnosis not present

## 2016-12-17 DIAGNOSIS — C700 Malignant neoplasm of cerebral meninges: Secondary | ICD-10-CM | POA: Diagnosis not present

## 2016-12-17 DIAGNOSIS — D320 Benign neoplasm of cerebral meninges: Secondary | ICD-10-CM | POA: Diagnosis not present

## 2016-12-17 DIAGNOSIS — Z510 Encounter for antineoplastic radiation therapy: Secondary | ICD-10-CM | POA: Diagnosis not present

## 2016-12-17 DIAGNOSIS — C060 Malignant neoplasm of cheek mucosa: Secondary | ICD-10-CM | POA: Diagnosis not present

## 2016-12-18 DIAGNOSIS — Z510 Encounter for antineoplastic radiation therapy: Secondary | ICD-10-CM | POA: Diagnosis not present

## 2016-12-18 DIAGNOSIS — C760 Malignant neoplasm of head, face and neck: Secondary | ICD-10-CM | POA: Diagnosis not present

## 2016-12-18 DIAGNOSIS — D329 Benign neoplasm of meninges, unspecified: Secondary | ICD-10-CM | POA: Diagnosis not present

## 2016-12-18 DIAGNOSIS — C700 Malignant neoplasm of cerebral meninges: Secondary | ICD-10-CM | POA: Diagnosis not present

## 2016-12-18 DIAGNOSIS — Z8589 Personal history of malignant neoplasm of other organs and systems: Secondary | ICD-10-CM | POA: Diagnosis not present

## 2016-12-18 DIAGNOSIS — C060 Malignant neoplasm of cheek mucosa: Secondary | ICD-10-CM | POA: Diagnosis not present

## 2016-12-18 DIAGNOSIS — D320 Benign neoplasm of cerebral meninges: Secondary | ICD-10-CM | POA: Diagnosis not present

## 2016-12-19 DIAGNOSIS — C700 Malignant neoplasm of cerebral meninges: Secondary | ICD-10-CM | POA: Diagnosis not present

## 2016-12-19 DIAGNOSIS — D320 Benign neoplasm of cerebral meninges: Secondary | ICD-10-CM | POA: Diagnosis not present

## 2016-12-19 DIAGNOSIS — C060 Malignant neoplasm of cheek mucosa: Secondary | ICD-10-CM | POA: Diagnosis not present

## 2016-12-19 DIAGNOSIS — D329 Benign neoplasm of meninges, unspecified: Secondary | ICD-10-CM | POA: Diagnosis not present

## 2016-12-19 DIAGNOSIS — Z510 Encounter for antineoplastic radiation therapy: Secondary | ICD-10-CM | POA: Diagnosis not present

## 2016-12-22 DIAGNOSIS — C760 Malignant neoplasm of head, face and neck: Secondary | ICD-10-CM | POA: Diagnosis not present

## 2016-12-22 DIAGNOSIS — Z510 Encounter for antineoplastic radiation therapy: Secondary | ICD-10-CM | POA: Diagnosis not present

## 2016-12-22 DIAGNOSIS — D320 Benign neoplasm of cerebral meninges: Secondary | ICD-10-CM | POA: Diagnosis not present

## 2016-12-22 DIAGNOSIS — D329 Benign neoplasm of meninges, unspecified: Secondary | ICD-10-CM | POA: Diagnosis not present

## 2016-12-22 DIAGNOSIS — C700 Malignant neoplasm of cerebral meninges: Secondary | ICD-10-CM | POA: Diagnosis not present

## 2016-12-23 DIAGNOSIS — C060 Malignant neoplasm of cheek mucosa: Secondary | ICD-10-CM | POA: Diagnosis not present

## 2016-12-23 DIAGNOSIS — D320 Benign neoplasm of cerebral meninges: Secondary | ICD-10-CM | POA: Diagnosis not present

## 2016-12-23 DIAGNOSIS — C700 Malignant neoplasm of cerebral meninges: Secondary | ICD-10-CM | POA: Diagnosis not present

## 2016-12-23 DIAGNOSIS — D329 Benign neoplasm of meninges, unspecified: Secondary | ICD-10-CM | POA: Diagnosis not present

## 2016-12-23 DIAGNOSIS — Z510 Encounter for antineoplastic radiation therapy: Secondary | ICD-10-CM | POA: Diagnosis not present

## 2016-12-24 DIAGNOSIS — C700 Malignant neoplasm of cerebral meninges: Secondary | ICD-10-CM | POA: Diagnosis not present

## 2016-12-24 DIAGNOSIS — D329 Benign neoplasm of meninges, unspecified: Secondary | ICD-10-CM | POA: Diagnosis not present

## 2016-12-24 DIAGNOSIS — D320 Benign neoplasm of cerebral meninges: Secondary | ICD-10-CM | POA: Diagnosis not present

## 2016-12-24 DIAGNOSIS — Z510 Encounter for antineoplastic radiation therapy: Secondary | ICD-10-CM | POA: Diagnosis not present

## 2016-12-24 DIAGNOSIS — C060 Malignant neoplasm of cheek mucosa: Secondary | ICD-10-CM | POA: Diagnosis not present

## 2016-12-25 DIAGNOSIS — D329 Benign neoplasm of meninges, unspecified: Secondary | ICD-10-CM | POA: Diagnosis not present

## 2016-12-25 DIAGNOSIS — K1230 Oral mucositis (ulcerative), unspecified: Secondary | ICD-10-CM | POA: Diagnosis not present

## 2016-12-25 DIAGNOSIS — C060 Malignant neoplasm of cheek mucosa: Secondary | ICD-10-CM | POA: Diagnosis not present

## 2016-12-25 DIAGNOSIS — Z510 Encounter for antineoplastic radiation therapy: Secondary | ICD-10-CM | POA: Diagnosis not present

## 2016-12-25 DIAGNOSIS — D320 Benign neoplasm of cerebral meninges: Secondary | ICD-10-CM | POA: Diagnosis not present

## 2016-12-25 DIAGNOSIS — C700 Malignant neoplasm of cerebral meninges: Secondary | ICD-10-CM | POA: Diagnosis not present

## 2016-12-26 DIAGNOSIS — Z510 Encounter for antineoplastic radiation therapy: Secondary | ICD-10-CM | POA: Diagnosis not present

## 2016-12-26 DIAGNOSIS — D329 Benign neoplasm of meninges, unspecified: Secondary | ICD-10-CM | POA: Diagnosis not present

## 2016-12-26 DIAGNOSIS — C060 Malignant neoplasm of cheek mucosa: Secondary | ICD-10-CM | POA: Diagnosis not present

## 2016-12-26 DIAGNOSIS — D320 Benign neoplasm of cerebral meninges: Secondary | ICD-10-CM | POA: Diagnosis not present

## 2016-12-26 DIAGNOSIS — C700 Malignant neoplasm of cerebral meninges: Secondary | ICD-10-CM | POA: Diagnosis not present

## 2016-12-29 DIAGNOSIS — C700 Malignant neoplasm of cerebral meninges: Secondary | ICD-10-CM | POA: Diagnosis not present

## 2016-12-29 DIAGNOSIS — C060 Malignant neoplasm of cheek mucosa: Secondary | ICD-10-CM | POA: Diagnosis not present

## 2016-12-29 DIAGNOSIS — Z510 Encounter for antineoplastic radiation therapy: Secondary | ICD-10-CM | POA: Diagnosis not present

## 2016-12-29 DIAGNOSIS — D320 Benign neoplasm of cerebral meninges: Secondary | ICD-10-CM | POA: Diagnosis not present

## 2016-12-29 DIAGNOSIS — D329 Benign neoplasm of meninges, unspecified: Secondary | ICD-10-CM | POA: Diagnosis not present

## 2016-12-29 DIAGNOSIS — R1312 Dysphagia, oropharyngeal phase: Secondary | ICD-10-CM | POA: Diagnosis not present

## 2016-12-30 DIAGNOSIS — D329 Benign neoplasm of meninges, unspecified: Secondary | ICD-10-CM | POA: Diagnosis not present

## 2016-12-30 DIAGNOSIS — D320 Benign neoplasm of cerebral meninges: Secondary | ICD-10-CM | POA: Diagnosis not present

## 2016-12-30 DIAGNOSIS — Z510 Encounter for antineoplastic radiation therapy: Secondary | ICD-10-CM | POA: Diagnosis not present

## 2016-12-30 DIAGNOSIS — C700 Malignant neoplasm of cerebral meninges: Secondary | ICD-10-CM | POA: Diagnosis not present

## 2016-12-30 DIAGNOSIS — C060 Malignant neoplasm of cheek mucosa: Secondary | ICD-10-CM | POA: Diagnosis not present

## 2016-12-31 DIAGNOSIS — Z510 Encounter for antineoplastic radiation therapy: Secondary | ICD-10-CM | POA: Diagnosis not present

## 2016-12-31 DIAGNOSIS — C700 Malignant neoplasm of cerebral meninges: Secondary | ICD-10-CM | POA: Diagnosis not present

## 2016-12-31 DIAGNOSIS — D329 Benign neoplasm of meninges, unspecified: Secondary | ICD-10-CM | POA: Diagnosis not present

## 2016-12-31 DIAGNOSIS — C060 Malignant neoplasm of cheek mucosa: Secondary | ICD-10-CM | POA: Diagnosis not present

## 2016-12-31 DIAGNOSIS — D320 Benign neoplasm of cerebral meninges: Secondary | ICD-10-CM | POA: Diagnosis not present

## 2017-01-01 DIAGNOSIS — C700 Malignant neoplasm of cerebral meninges: Secondary | ICD-10-CM | POA: Diagnosis not present

## 2017-01-01 DIAGNOSIS — E785 Hyperlipidemia, unspecified: Secondary | ICD-10-CM | POA: Diagnosis not present

## 2017-01-01 DIAGNOSIS — Z79899 Other long term (current) drug therapy: Secondary | ICD-10-CM | POA: Diagnosis not present

## 2017-01-01 DIAGNOSIS — Z885 Allergy status to narcotic agent status: Secondary | ICD-10-CM | POA: Diagnosis not present

## 2017-01-01 DIAGNOSIS — Z7982 Long term (current) use of aspirin: Secondary | ICD-10-CM | POA: Diagnosis not present

## 2017-01-01 DIAGNOSIS — Z5111 Encounter for antineoplastic chemotherapy: Secondary | ICD-10-CM | POA: Diagnosis not present

## 2017-01-01 DIAGNOSIS — Z880 Allergy status to penicillin: Secondary | ICD-10-CM | POA: Diagnosis not present

## 2017-01-01 DIAGNOSIS — C060 Malignant neoplasm of cheek mucosa: Secondary | ICD-10-CM | POA: Diagnosis not present

## 2017-01-01 DIAGNOSIS — Z510 Encounter for antineoplastic radiation therapy: Secondary | ICD-10-CM | POA: Diagnosis not present

## 2017-01-01 DIAGNOSIS — K219 Gastro-esophageal reflux disease without esophagitis: Secondary | ICD-10-CM | POA: Diagnosis not present

## 2017-01-01 DIAGNOSIS — I2510 Atherosclerotic heart disease of native coronary artery without angina pectoris: Secondary | ICD-10-CM | POA: Diagnosis not present

## 2017-01-01 DIAGNOSIS — Z9889 Other specified postprocedural states: Secondary | ICD-10-CM | POA: Diagnosis not present

## 2017-01-01 DIAGNOSIS — Z882 Allergy status to sulfonamides status: Secondary | ICD-10-CM | POA: Diagnosis not present

## 2017-01-01 DIAGNOSIS — I252 Old myocardial infarction: Secondary | ICD-10-CM | POA: Diagnosis not present

## 2017-01-01 DIAGNOSIS — D320 Benign neoplasm of cerebral meninges: Secondary | ICD-10-CM | POA: Diagnosis not present

## 2017-01-01 DIAGNOSIS — I10 Essential (primary) hypertension: Secondary | ICD-10-CM | POA: Diagnosis not present

## 2017-01-01 DIAGNOSIS — D329 Benign neoplasm of meninges, unspecified: Secondary | ICD-10-CM | POA: Diagnosis not present

## 2017-01-01 DIAGNOSIS — C760 Malignant neoplasm of head, face and neck: Secondary | ICD-10-CM | POA: Diagnosis not present

## 2017-01-02 DIAGNOSIS — D320 Benign neoplasm of cerebral meninges: Secondary | ICD-10-CM | POA: Diagnosis not present

## 2017-01-02 DIAGNOSIS — C700 Malignant neoplasm of cerebral meninges: Secondary | ICD-10-CM | POA: Diagnosis not present

## 2017-01-02 DIAGNOSIS — R1312 Dysphagia, oropharyngeal phase: Secondary | ICD-10-CM | POA: Diagnosis not present

## 2017-01-02 DIAGNOSIS — C060 Malignant neoplasm of cheek mucosa: Secondary | ICD-10-CM | POA: Diagnosis not present

## 2017-01-02 DIAGNOSIS — Z510 Encounter for antineoplastic radiation therapy: Secondary | ICD-10-CM | POA: Diagnosis not present

## 2017-01-02 DIAGNOSIS — D329 Benign neoplasm of meninges, unspecified: Secondary | ICD-10-CM | POA: Diagnosis not present

## 2017-01-02 DIAGNOSIS — C760 Malignant neoplasm of head, face and neck: Secondary | ICD-10-CM | POA: Diagnosis not present

## 2017-01-05 DIAGNOSIS — R1312 Dysphagia, oropharyngeal phase: Secondary | ICD-10-CM | POA: Diagnosis not present

## 2017-01-05 DIAGNOSIS — C700 Malignant neoplasm of cerebral meninges: Secondary | ICD-10-CM | POA: Diagnosis not present

## 2017-01-05 DIAGNOSIS — D320 Benign neoplasm of cerebral meninges: Secondary | ICD-10-CM | POA: Diagnosis not present

## 2017-01-05 DIAGNOSIS — D329 Benign neoplasm of meninges, unspecified: Secondary | ICD-10-CM | POA: Diagnosis not present

## 2017-01-05 DIAGNOSIS — Z510 Encounter for antineoplastic radiation therapy: Secondary | ICD-10-CM | POA: Diagnosis not present

## 2017-01-05 DIAGNOSIS — C760 Malignant neoplasm of head, face and neck: Secondary | ICD-10-CM | POA: Diagnosis not present

## 2017-01-06 DIAGNOSIS — C700 Malignant neoplasm of cerebral meninges: Secondary | ICD-10-CM | POA: Diagnosis not present

## 2017-01-06 DIAGNOSIS — D329 Benign neoplasm of meninges, unspecified: Secondary | ICD-10-CM | POA: Diagnosis not present

## 2017-01-06 DIAGNOSIS — D320 Benign neoplasm of cerebral meninges: Secondary | ICD-10-CM | POA: Diagnosis not present

## 2017-01-06 DIAGNOSIS — Z510 Encounter for antineoplastic radiation therapy: Secondary | ICD-10-CM | POA: Diagnosis not present

## 2017-01-06 DIAGNOSIS — C060 Malignant neoplasm of cheek mucosa: Secondary | ICD-10-CM | POA: Diagnosis not present

## 2017-01-07 DIAGNOSIS — D329 Benign neoplasm of meninges, unspecified: Secondary | ICD-10-CM | POA: Diagnosis not present

## 2017-01-07 DIAGNOSIS — C700 Malignant neoplasm of cerebral meninges: Secondary | ICD-10-CM | POA: Diagnosis not present

## 2017-01-07 DIAGNOSIS — Z510 Encounter for antineoplastic radiation therapy: Secondary | ICD-10-CM | POA: Diagnosis not present

## 2017-01-07 DIAGNOSIS — D320 Benign neoplasm of cerebral meninges: Secondary | ICD-10-CM | POA: Diagnosis not present

## 2017-01-07 DIAGNOSIS — C060 Malignant neoplasm of cheek mucosa: Secondary | ICD-10-CM | POA: Diagnosis not present

## 2017-01-08 DIAGNOSIS — C760 Malignant neoplasm of head, face and neck: Secondary | ICD-10-CM | POA: Diagnosis not present

## 2017-01-08 DIAGNOSIS — R1312 Dysphagia, oropharyngeal phase: Secondary | ICD-10-CM | POA: Diagnosis not present

## 2017-01-08 DIAGNOSIS — D329 Benign neoplasm of meninges, unspecified: Secondary | ICD-10-CM | POA: Diagnosis not present

## 2017-01-08 DIAGNOSIS — Z510 Encounter for antineoplastic radiation therapy: Secondary | ICD-10-CM | POA: Diagnosis not present

## 2017-01-08 DIAGNOSIS — D320 Benign neoplasm of cerebral meninges: Secondary | ICD-10-CM | POA: Diagnosis not present

## 2017-01-08 DIAGNOSIS — C060 Malignant neoplasm of cheek mucosa: Secondary | ICD-10-CM | POA: Diagnosis not present

## 2017-01-08 DIAGNOSIS — C700 Malignant neoplasm of cerebral meninges: Secondary | ICD-10-CM | POA: Diagnosis not present

## 2017-01-13 DIAGNOSIS — C060 Malignant neoplasm of cheek mucosa: Secondary | ICD-10-CM | POA: Diagnosis not present

## 2017-01-26 DIAGNOSIS — C069 Malignant neoplasm of mouth, unspecified: Secondary | ICD-10-CM | POA: Diagnosis not present

## 2017-01-26 DIAGNOSIS — I10 Essential (primary) hypertension: Secondary | ICD-10-CM | POA: Diagnosis not present

## 2017-01-26 DIAGNOSIS — Z1389 Encounter for screening for other disorder: Secondary | ICD-10-CM | POA: Diagnosis not present

## 2017-01-26 DIAGNOSIS — Z1211 Encounter for screening for malignant neoplasm of colon: Secondary | ICD-10-CM | POA: Diagnosis not present

## 2017-01-26 DIAGNOSIS — L409 Psoriasis, unspecified: Secondary | ICD-10-CM | POA: Diagnosis not present

## 2017-01-26 DIAGNOSIS — I2510 Atherosclerotic heart disease of native coronary artery without angina pectoris: Secondary | ICD-10-CM | POA: Diagnosis not present

## 2017-01-26 DIAGNOSIS — I252 Old myocardial infarction: Secondary | ICD-10-CM | POA: Diagnosis not present

## 2017-01-26 DIAGNOSIS — F329 Major depressive disorder, single episode, unspecified: Secondary | ICD-10-CM | POA: Diagnosis not present

## 2017-01-26 DIAGNOSIS — E7800 Pure hypercholesterolemia, unspecified: Secondary | ICD-10-CM | POA: Diagnosis not present

## 2017-01-26 DIAGNOSIS — Z0000 Encounter for general adult medical examination without abnormal findings: Secondary | ICD-10-CM | POA: Diagnosis not present

## 2017-01-26 DIAGNOSIS — D329 Benign neoplasm of meninges, unspecified: Secondary | ICD-10-CM | POA: Diagnosis not present

## 2017-01-26 DIAGNOSIS — Z125 Encounter for screening for malignant neoplasm of prostate: Secondary | ICD-10-CM | POA: Diagnosis not present

## 2017-02-17 DIAGNOSIS — Z885 Allergy status to narcotic agent status: Secondary | ICD-10-CM | POA: Diagnosis not present

## 2017-02-17 DIAGNOSIS — Z7982 Long term (current) use of aspirin: Secondary | ICD-10-CM | POA: Diagnosis not present

## 2017-02-17 DIAGNOSIS — Z08 Encounter for follow-up examination after completed treatment for malignant neoplasm: Secondary | ICD-10-CM | POA: Diagnosis not present

## 2017-02-17 DIAGNOSIS — C060 Malignant neoplasm of cheek mucosa: Secondary | ICD-10-CM | POA: Diagnosis not present

## 2017-02-17 DIAGNOSIS — Z79899 Other long term (current) drug therapy: Secondary | ICD-10-CM | POA: Diagnosis not present

## 2017-02-17 DIAGNOSIS — E876 Hypokalemia: Secondary | ICD-10-CM | POA: Diagnosis not present

## 2017-02-17 DIAGNOSIS — E785 Hyperlipidemia, unspecified: Secondary | ICD-10-CM | POA: Diagnosis not present

## 2017-02-17 DIAGNOSIS — I10 Essential (primary) hypertension: Secondary | ICD-10-CM | POA: Diagnosis not present

## 2017-02-17 DIAGNOSIS — I2510 Atherosclerotic heart disease of native coronary artery without angina pectoris: Secondary | ICD-10-CM | POA: Diagnosis not present

## 2017-02-17 DIAGNOSIS — I252 Old myocardial infarction: Secondary | ICD-10-CM | POA: Diagnosis not present

## 2017-02-17 DIAGNOSIS — Z85818 Personal history of malignant neoplasm of other sites of lip, oral cavity, and pharynx: Secondary | ICD-10-CM | POA: Diagnosis not present

## 2017-02-17 DIAGNOSIS — Z880 Allergy status to penicillin: Secondary | ICD-10-CM | POA: Diagnosis not present

## 2017-02-17 DIAGNOSIS — Z882 Allergy status to sulfonamides status: Secondary | ICD-10-CM | POA: Diagnosis not present

## 2017-03-16 DIAGNOSIS — Z5111 Encounter for antineoplastic chemotherapy: Secondary | ICD-10-CM | POA: Diagnosis not present

## 2017-04-07 DIAGNOSIS — C060 Malignant neoplasm of cheek mucosa: Secondary | ICD-10-CM | POA: Diagnosis not present

## 2017-04-09 DIAGNOSIS — R5383 Other fatigue: Secondary | ICD-10-CM | POA: Diagnosis not present

## 2017-04-09 DIAGNOSIS — C060 Malignant neoplasm of cheek mucosa: Secondary | ICD-10-CM | POA: Diagnosis not present

## 2017-04-09 DIAGNOSIS — C760 Malignant neoplasm of head, face and neck: Secondary | ICD-10-CM | POA: Diagnosis not present

## 2017-04-14 ENCOUNTER — Encounter: Payer: Self-pay | Admitting: Cardiology

## 2017-04-14 DIAGNOSIS — C060 Malignant neoplasm of cheek mucosa: Secondary | ICD-10-CM | POA: Diagnosis not present

## 2017-04-14 DIAGNOSIS — C760 Malignant neoplasm of head, face and neck: Secondary | ICD-10-CM | POA: Diagnosis not present

## 2017-04-14 DIAGNOSIS — I10 Essential (primary) hypertension: Secondary | ICD-10-CM | POA: Diagnosis not present

## 2017-04-14 DIAGNOSIS — R5383 Other fatigue: Secondary | ICD-10-CM | POA: Diagnosis not present

## 2017-04-14 DIAGNOSIS — R1312 Dysphagia, oropharyngeal phase: Secondary | ICD-10-CM | POA: Diagnosis not present

## 2017-04-14 DIAGNOSIS — E876 Hypokalemia: Secondary | ICD-10-CM | POA: Diagnosis not present

## 2017-04-14 DIAGNOSIS — R252 Cramp and spasm: Secondary | ICD-10-CM | POA: Diagnosis not present

## 2017-04-21 DIAGNOSIS — C760 Malignant neoplasm of head, face and neck: Secondary | ICD-10-CM | POA: Diagnosis not present

## 2017-04-21 DIAGNOSIS — R220 Localized swelling, mass and lump, head: Secondary | ICD-10-CM | POA: Diagnosis not present

## 2017-04-21 DIAGNOSIS — Z6826 Body mass index (BMI) 26.0-26.9, adult: Secondary | ICD-10-CM | POA: Diagnosis not present

## 2017-04-21 DIAGNOSIS — Z8589 Personal history of malignant neoplasm of other organs and systems: Secondary | ICD-10-CM | POA: Diagnosis not present

## 2017-04-22 NOTE — Progress Notes (Deleted)
Patient ID: Christian Ball MRN: [id number], DOB/AGE: 68-Jun-1952   Date of Visit: 04/22/2017  Primary Physician: Antony Contras, MD Primary Cardiologist:  Sorayah Schrodt [place], MD   History of Present Illness  Christian Ball is a 67 y.o. male with HTN and dyslipidemia who was admitted 12/ 14 with a  NSTEMI. He underwent cardiac cath showing single vessel obstructive CAD involving the second PLOM branch of the RCA, with L-R collaterals. Residual 20-30% mid LAD disease, otherwise normal LCx. Medical therapy was recommended. He was placed on Plavix and Imdur. Peak troponin 4.31. EF was 55-65%. 2D echo was obtained showing basal inferior and inferolateral hypokinesis and mid inferior wall hypokinesis, EF 45-50%, mild MR. Developed hypotension on BB and it was stopped. He also has a history of SSCA of the tongue.   In March 2017 he was evaluated and found to have a mass in the sphenoid region. He had resection of a meningioma on June 07, 2015.   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 According to the patient it was a small mass with clear margins and all lymph nodes negative. Subsequently PET/CT showed an area of concern in the right mandibular area with 2 suspicious lymph nodes. Had needle biopsy which confirmed recurrent disease. Had a course of chemotherapy and RT staring last August.   From a cardiac standpoint he denies any chest pain, SOB, or edema. Energy level is OK. Weight is stable.   Past Medical History Past Medical History:  Diagnosis Date  . CAD (coronary artery disease)    a. 02/2013: NSTEMI -> cath 2nd PLOM branch with L-R collaterals, for medical rx. Consider PCI if refractory sx.  . Dyslipidemia   . Hospital acquired PNA   . Hypertension   . Ischemic cardiomyopathy    a. 02/2013 adm - EF 45-50% by echo, 55-65% by cath.  . Leukocytosis    a. During 03/2103 adm. May need  OP recheck.  . Meningioma ([place])   . Nocturnal oxygen desaturation    a. Instructed to f/u PCP for possible sleep study.  . Situational stress     Past Surgical History None  Allergies/Intolerances Allergies  Allergen Reactions  . Codeine Rash  . Penicillins Rash  . Sulfa Antibiotics Rash    Current Home Medications Current Outpatient Medications  Medication Sig Dispense Refill  . amLODipine (NORVASC) 5 MG tablet Take 5 mg by mouth daily.    Marland Kitchen aspirin EC 81 MG tablet Take 325 mg by mouth daily.     Marland Kitchen atorvastatin (LIPITOR) 20 MG tablet Take 1 tablet (20 mg total) by mouth daily at 6 PM. 90 tablet 3  . escitalopram (LEXAPRO) 5 MG tablet Take 10 mg by mouth daily.     . isosorbide mononitrate (IMDUR) 30 MG 24 hr tablet Take 1 tablet (30 mg total) by mouth daily. 90 tablet 2  . Multiple Vitamin (MULTIVITAMIN WITH MINERALS) TABS tablet Take 1 tablet by mouth daily.    . nitroGLYCERIN (NITROSTAT) 0.4 MG SL tablet Place 1 tablet (0.4 mg total) under the tongue every 5 (five) minutes as needed for chest pain (up to 3 doses). 25 tablet 3   No current facility-administered medications for this visit.     Social History Social History   Socioeconomic History  . Marital status: Married    Spouse name: Not on file  . Number of children: Not on  file  . Years of education: Not on file  . Highest education level: Not on file  Social Needs  . Financial resource strain: Not on file  . Food insecurity - worry: Not on file  . Food insecurity - inability: Not on file  . Transportation needs - medical: Not on file  . Transportation needs - non-medical: Not on file  Occupational History  . Not on file  Tobacco Use  . Smoking status: Never Smoker  . Smokeless tobacco: Never Used  . Tobacco comment: has had occasional cigar.  Substance and Sexual Activity  . Alcohol use: Yes    Comment: 2 drinks/year  . Drug use: No  . Sexual activity: Yes  Other Topics Concern  . Not on file    Social History Narrative   Lives in [place] with his wife.  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  Not currently exercising on routine basis.     Review of Systems As noted in HPI. All other systems reviewed and are otherwise negative except as noted above.  Physical Exam Vitals: There were no vitals taken for this visit.  General: Well developed, well appearing 67 y.o. male in no acute distress. HEENT: Normocephalic.  EOMs intact. Sclera nonicteric. Oropharynx clear. S/p neck dissection.  Neck: Supple without bruits. No JVD. Lungs: Respirations regular and unlabored, CTA bilaterally. No wheezes, rales or rhonchi. Heart: RRR. S1, S2 present. No murmurs, rub, S3 or S4. Abdomen: Soft, non-distended.  Extremities: No clubbing, cyanosis or edema. PT/Radials 2+ and equal bilaterally. Right arm skin graft site is healing. Psych: Normal affect. Neuro: Alert and oriented X 3. Moves all extremities spontaneously.   Laboratory data:  Lab Results  Component Value Date   WBC 12.1 (H) 03/29/2013   HGB 14.6 02/13/2014   HCT 34.4 (L) 03/29/2013   PLT 238 03/29/2013   GLUCOSE 84 02/08/2014   CHOL 146 03/28/2013   TRIG 138 03/28/2013   HDL 38 (L) 03/28/2013   LDLCALC 80 03/28/2013   ALT 23 03/27/2013   AST 30 03/27/2013   NA 138 02/08/2014   K 3.6 (L) 02/08/2014   CL 95 (L) 02/08/2014   CREATININE 1.08 02/08/2014   BUN 17 02/08/2014   CO2 30 02/08/2014   TSH 1.239 03/27/2013   INR 1.00 03/29/2013   HGBA1C 5.6 03/27/2013   Labs dated 01/07/16: cholesterol 106, triglycerides 79, LDL 51, HDL 39. CMET, TSH normal. Hgb 14.5 Dated 02/28/16: normal BMET, WBC 13.2. Hgb 9.7 Dated 09/02/16: creatinine1.1. Dated 07/14/16: cholesterol 104, triglycerides 82, HDL 44, LDL 44. CMET normal. Dated 12/12/16: sodium 133, glucose 235. Otherwise CMET normal. Hgb 11.9   Ecg today shows NSR with LAD, ? Incomplete RBBB. I have personally reviewed and interpreted this study.   Assessment and Plan 1.  CAD s/p  NSTEMI  03/14/13, currently asymptomatic. - 2nd PLOM branch 90% with L-R collaterals, medical management recommended - continue medical therapy with ASA and Imdur.  -Continue dietary and exercise modifications.  2.  HTN - BP well controlled today. - continue current regimen  3. Dyslipidemia- excellent control. - continue statin   4. SSCA of the tongue s/p resection. Now with recurrence. S/p modified radical neck resection in October. Awaiting repeat biopsy results. Planning aggressive RT and chemotherapy.  5. S/p resection of meningioma  I will follow up in 6 months.

## 2017-04-30 ENCOUNTER — Ambulatory Visit: Payer: Medicare Other | Admitting: Cardiology

## 2017-04-30 DIAGNOSIS — C060 Malignant neoplasm of cheek mucosa: Secondary | ICD-10-CM | POA: Diagnosis not present

## 2017-05-14 DIAGNOSIS — Z7982 Long term (current) use of aspirin: Secondary | ICD-10-CM | POA: Diagnosis not present

## 2017-05-14 DIAGNOSIS — Z9889 Other specified postprocedural states: Secondary | ICD-10-CM | POA: Diagnosis not present

## 2017-05-14 DIAGNOSIS — I252 Old myocardial infarction: Secondary | ICD-10-CM | POA: Diagnosis not present

## 2017-05-14 DIAGNOSIS — Z85828 Personal history of other malignant neoplasm of skin: Secondary | ICD-10-CM | POA: Diagnosis not present

## 2017-05-14 DIAGNOSIS — Z881 Allergy status to other antibiotic agents status: Secondary | ICD-10-CM | POA: Diagnosis not present

## 2017-05-14 DIAGNOSIS — Z79899 Other long term (current) drug therapy: Secondary | ICD-10-CM | POA: Diagnosis not present

## 2017-05-14 DIAGNOSIS — Z880 Allergy status to penicillin: Secondary | ICD-10-CM | POA: Diagnosis not present

## 2017-05-14 DIAGNOSIS — C060 Malignant neoplasm of cheek mucosa: Secondary | ICD-10-CM | POA: Diagnosis not present

## 2017-05-14 DIAGNOSIS — M792 Neuralgia and neuritis, unspecified: Secondary | ICD-10-CM | POA: Diagnosis not present

## 2017-05-14 DIAGNOSIS — Z885 Allergy status to narcotic agent status: Secondary | ICD-10-CM | POA: Diagnosis not present

## 2017-05-14 DIAGNOSIS — I10 Essential (primary) hypertension: Secondary | ICD-10-CM | POA: Diagnosis not present

## 2017-05-18 DIAGNOSIS — C060 Malignant neoplasm of cheek mucosa: Secondary | ICD-10-CM | POA: Diagnosis not present

## 2017-05-19 DIAGNOSIS — Z923 Personal history of irradiation: Secondary | ICD-10-CM | POA: Diagnosis not present

## 2017-05-19 DIAGNOSIS — C060 Malignant neoplasm of cheek mucosa: Secondary | ICD-10-CM | POA: Diagnosis not present

## 2017-05-19 DIAGNOSIS — Z79899 Other long term (current) drug therapy: Secondary | ICD-10-CM | POA: Diagnosis not present

## 2017-05-19 DIAGNOSIS — G893 Neoplasm related pain (acute) (chronic): Secondary | ICD-10-CM | POA: Diagnosis not present

## 2017-05-19 DIAGNOSIS — Z7982 Long term (current) use of aspirin: Secondary | ICD-10-CM | POA: Diagnosis not present

## 2017-05-23 NOTE — Progress Notes (Deleted)
Patient ID: Christian Ball MRN: [id number], DOB/AGE: [birth year]   Date of Visit: 05/23/2017  Primary Physician: Antony Contras, MD Primary Cardiologist:  Peter Martinique, MD   History of Present Illness  Christian Ball is a 67 y.o. male with HTN and dyslipidemia who was admitted 12/ 14 with a  NSTEMI. He underwent cardiac cath showing single vessel obstructive CAD involving the second PLOM branch of the RCA, with L-R collaterals. Residual 20-30% mid LAD disease, otherwise normal LCx. Medical therapy was recommended. He was placed on Plavix and Imdur. Peak troponin 4.31. EF was 55-65%. 2D echo was obtained showing basal inferior and inferolateral hypokinesis and mid inferior wall hypokinesis, EF 45-50%, mild MR. Developed hypotension on BB and it was stopped. He also has a history of SSCA of the tongue.   In March 2017 he was evaluated and found to have a mass in the sphenoid region. He had resection of a meningioma on June 07, 2015.   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 According to the patient it was a small mass with clear margins and all lymph nodes negative. Subsequently PET/CT showed an area of concern in the right mandibular area with 2 suspicious lymph nodes. Had needle biopsy which confirmed recurrent disease. Had a course of chemotherapy and RT staring last August.   From a cardiac standpoint he denies any chest pain, SOB, or edema. Energy level is OK. Weight is stable.   Past Medical History Past Medical History:  Diagnosis Date  . CAD (coronary artery disease)    a. 02/2013: NSTEMI -> cath 2nd PLOM branch with L-R collaterals, for medical rx. Consider PCI if refractory sx.  . Dyslipidemia   . Hospital acquired PNA   . Hypertension   . Ischemic cardiomyopathy    a. 02/2013 adm - EF 45-50% by echo, 55-65% by cath.  . Leukocytosis    a. During 03/2103 adm. May need  OP recheck.  . Meningioma ([place])   . Nocturnal oxygen desaturation    a. Instructed to f/u PCP for possible sleep study.  . Situational stress     Past Surgical History None  Allergies/Intolerances Allergies  Allergen Reactions  . Codeine Rash  . Penicillins Rash  . Sulfa Antibiotics Rash    Current Home Medications Current Outpatient Medications  Medication Sig Dispense Refill  . amLODipine (NORVASC) 5 MG tablet Take 5 mg by mouth daily.    Marland Kitchen aspirin EC 81 MG tablet Take 325 mg by mouth daily.     Marland Kitchen atorvastatin (LIPITOR) 20 MG tablet Take 1 tablet (20 mg total) by mouth daily at 6 PM. 90 tablet 3  . escitalopram (LEXAPRO) 5 MG tablet Take 10 mg by mouth daily.     . isosorbide mononitrate (IMDUR) 30 MG 24 hr tablet Take 1 tablet (30 mg total) by mouth daily. 90 tablet 2  . Multiple Vitamin (MULTIVITAMIN WITH MINERALS) TABS tablet Take 1 tablet by mouth daily.    . nitroGLYCERIN (NITROSTAT) 0.4 MG SL tablet Place 1 tablet (0.4 mg total) under the tongue every 5 (five) minutes as needed for chest pain (up to 3 doses). 25 tablet 3   No current facility-administered medications for this visit.     Social History Social History   Socioeconomic History  . Marital status: Married    Spouse name: Not on file  . Number of children: Not on  file  . Years of education: Not on file  . Highest education level: Not on file  Social Needs  . Financial resource strain: Not on file  . Food insecurity - worry: Not on file  . Food insecurity - inability: Not on file  . Transportation needs - medical: Not on file  . Transportation needs - non-medical: Not on file  Occupational History  . Not on file  Tobacco Use  . Smoking status: Never Smoker  . Smokeless tobacco: Never Used  . Tobacco comment: has had occasional cigar.  Substance and Sexual Activity  . Alcohol use: Yes    Comment: 2 drinks/year  . Drug use: No  . Sexual activity: Yes  Other Topics Concern  . Not on file    Social History Narrative   Lives in [place] with his wife.  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  Not currently exercising on routine basis.     Review of Systems As noted in HPI. All other systems reviewed and are otherwise negative except as noted above.  Physical Exam Vitals: There were no vitals taken for this visit.  General: Well developed, well appearing 67 y.o. male in no acute distress. HEENT: Normocephalic.  EOMs intact. Sclera nonicteric. Oropharynx clear. S/p neck dissection.  Neck: Supple without bruits. No JVD. Lungs: Respirations regular and unlabored, CTA bilaterally. No wheezes, rales or rhonchi. Heart: RRR. S1, S2 present. No murmurs, rub, S3 or S4. Abdomen: Soft, non-distended.  Extremities: No clubbing, cyanosis or edema. PT/Radials 2+ and equal bilaterally. Right arm skin graft site is healing. Psych: Normal affect. Neuro: Alert and oriented X 3. Moves all extremities spontaneously.   Laboratory data:  Lab Results  Component Value Date   WBC 12.1 (H) 03/29/2013   HGB 14.6 02/13/2014   HCT 34.4 (L) 03/29/2013   PLT 238 03/29/2013   GLUCOSE 84 02/08/2014   CHOL 146 03/28/2013   TRIG 138 03/28/2013   HDL 38 (L) 03/28/2013   LDLCALC 80 03/28/2013   ALT 23 03/27/2013   AST 30 03/27/2013   NA 138 02/08/2014   K 3.6 (L) 02/08/2014   CL 95 (L) 02/08/2014   CREATININE 1.08 02/08/2014   BUN 17 02/08/2014   CO2 30 02/08/2014   TSH 1.239 03/27/2013   INR 1.00 03/29/2013   HGBA1C 5.6 03/27/2013   Labs dated 01/07/16: cholesterol 106, triglycerides 79, LDL 51, HDL 39. CMET, TSH normal. Hgb 14.5 Dated 02/28/16: normal BMET, WBC 13.2. Hgb 9.7 Dated 09/02/16: creatinine1.1. Dated 07/14/16: cholesterol 104, triglycerides 82, HDL 44, LDL 44. CMET normal. Dated 12/12/16: sodium 133, glucose 235. Otherwise CMET normal. Hgb 11.9 Dated 01/26/17: cholesterol 129, triglycerides 104. HDL 46, LDL 62, Hgb 11.9.  Dated 03/16/17: creatinine 1.23.   Ecg today shows NSR with LAD, ?  Incomplete RBBB. I have personally reviewed and interpreted this study.   Assessment and Plan 1.  CAD s/p  NSTEMI 03/14/13, currently asymptomatic. - 2nd PLOM branch 90% with L-R collaterals, medical management recommended - continue medical therapy with ASA and Imdur.  -Continue dietary and exercise modifications.  2.  HTN - BP well controlled today. - continue current regimen  3. Dyslipidemia- excellent control. - continue statin   4. SSCA of the tongue s/p resection. Now with recurrence. S/p modified radical neck resection in October. Awaiting repeat biopsy results. Planning aggressive RT and chemotherapy.  5. S/p resection of meningioma  I will follow up in 6 months.

## 2017-05-25 DIAGNOSIS — F329 Major depressive disorder, single episode, unspecified: Secondary | ICD-10-CM | POA: Diagnosis not present

## 2017-05-25 DIAGNOSIS — Z882 Allergy status to sulfonamides status: Secondary | ICD-10-CM | POA: Diagnosis not present

## 2017-05-25 DIAGNOSIS — Z880 Allergy status to penicillin: Secondary | ICD-10-CM | POA: Diagnosis not present

## 2017-05-25 DIAGNOSIS — Z7982 Long term (current) use of aspirin: Secondary | ICD-10-CM | POA: Diagnosis not present

## 2017-05-25 DIAGNOSIS — Z923 Personal history of irradiation: Secondary | ICD-10-CM | POA: Diagnosis not present

## 2017-05-25 DIAGNOSIS — C490 Malignant neoplasm of connective and soft tissue of head, face and neck: Secondary | ICD-10-CM | POA: Diagnosis not present

## 2017-05-25 DIAGNOSIS — Z885 Allergy status to narcotic agent status: Secondary | ICD-10-CM | POA: Diagnosis not present

## 2017-05-25 DIAGNOSIS — Z79899 Other long term (current) drug therapy: Secondary | ICD-10-CM | POA: Diagnosis not present

## 2017-05-25 DIAGNOSIS — L409 Psoriasis, unspecified: Secondary | ICD-10-CM | POA: Diagnosis not present

## 2017-05-25 DIAGNOSIS — C060 Malignant neoplasm of cheek mucosa: Secondary | ICD-10-CM | POA: Diagnosis not present

## 2017-05-25 DIAGNOSIS — Z6824 Body mass index (BMI) 24.0-24.9, adult: Secondary | ICD-10-CM | POA: Diagnosis not present

## 2017-05-25 DIAGNOSIS — C770 Secondary and unspecified malignant neoplasm of lymph nodes of head, face and neck: Secondary | ICD-10-CM | POA: Diagnosis not present

## 2017-05-25 DIAGNOSIS — R112 Nausea with vomiting, unspecified: Secondary | ICD-10-CM | POA: Diagnosis not present

## 2017-05-25 DIAGNOSIS — I2510 Atherosclerotic heart disease of native coronary artery without angina pectoris: Secondary | ICD-10-CM | POA: Diagnosis not present

## 2017-05-25 DIAGNOSIS — Z9221 Personal history of antineoplastic chemotherapy: Secondary | ICD-10-CM | POA: Diagnosis not present

## 2017-05-28 ENCOUNTER — Ambulatory Visit: Payer: Medicare Other | Admitting: Cardiology

## 2017-05-29 ENCOUNTER — Encounter: Payer: Self-pay | Admitting: Cardiology

## 2017-06-01 DIAGNOSIS — Z5112 Encounter for antineoplastic immunotherapy: Secondary | ICD-10-CM | POA: Diagnosis not present

## 2017-06-01 DIAGNOSIS — Z79899 Other long term (current) drug therapy: Secondary | ICD-10-CM | POA: Diagnosis not present

## 2017-06-01 DIAGNOSIS — G893 Neoplasm related pain (acute) (chronic): Secondary | ICD-10-CM | POA: Diagnosis not present

## 2017-06-01 DIAGNOSIS — Z882 Allergy status to sulfonamides status: Secondary | ICD-10-CM | POA: Diagnosis not present

## 2017-06-01 DIAGNOSIS — Z6824 Body mass index (BMI) 24.0-24.9, adult: Secondary | ICD-10-CM | POA: Diagnosis not present

## 2017-06-01 DIAGNOSIS — Z7982 Long term (current) use of aspirin: Secondary | ICD-10-CM | POA: Diagnosis not present

## 2017-06-01 DIAGNOSIS — K5900 Constipation, unspecified: Secondary | ICD-10-CM | POA: Diagnosis not present

## 2017-06-01 DIAGNOSIS — C760 Malignant neoplasm of head, face and neck: Secondary | ICD-10-CM | POA: Diagnosis not present

## 2017-06-01 DIAGNOSIS — Z885 Allergy status to narcotic agent status: Secondary | ICD-10-CM | POA: Diagnosis not present

## 2017-06-01 DIAGNOSIS — F329 Major depressive disorder, single episode, unspecified: Secondary | ICD-10-CM | POA: Diagnosis not present

## 2017-06-01 DIAGNOSIS — Z880 Allergy status to penicillin: Secondary | ICD-10-CM | POA: Diagnosis not present

## 2017-06-01 DIAGNOSIS — I2510 Atherosclerotic heart disease of native coronary artery without angina pectoris: Secondary | ICD-10-CM | POA: Diagnosis not present

## 2017-06-01 DIAGNOSIS — Z79891 Long term (current) use of opiate analgesic: Secondary | ICD-10-CM | POA: Diagnosis not present

## 2017-06-04 ENCOUNTER — Telehealth: Payer: Self-pay | Admitting: Cardiology

## 2017-06-04 NOTE — Telephone Encounter (Signed)
Returned call to patient.He stated he has cancer in check.Stated he is taking immuno therapy.B/P up and down.Stated he had to cancel last weeks appointment with Dr.Jordan he would like to reschedule.Appointment scheduled Mon 06/08/17 at 1:40 pm.

## 2017-06-04 NOTE — Telephone Encounter (Signed)
New Message:     Please call,blood pressure have been running high.He thinks his medicine might need to be adjusted.

## 2017-06-06 NOTE — Progress Notes (Signed)
Patient ID: Christian Ball MRN: [id number], DOB/AGE: 1950/07/15   Date of Visit: 06/08/2017  Primary Physician: Antony Contras, MD Primary Cardiologist:  Finn Altemose Martin[place], MD   History of Present Illness  Christian Ball is a 67 y.o. male with HTN and dyslipidemia who was admitted 12/ 14 with a  NSTEMI. He underwent cardiac cath showing single vessel obstructive CAD involving the second PLOM branch of the RCA, with L-R collaterals. Residual 20-30% mid LAD disease, otherwise normal LCx. Medical therapy was recommended. He was placed on Plavix and Imdur. Peak troponin 4.31. EF was 55-65%. 2D echo was obtained showing basal inferior and inferolateral hypokinesis and mid inferior wall hypokinesis, EF 45-50%, mild MR. Developed hypotension on BB and it was stopped. He also has a history of SSCA of the tongue.   In March 2017 he was evaluated and found to have a mass in the sphenoid region. He had resection of a meningioma on June 07, 2015.   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 According to the patient it was a small mass with clear margins and all lymph nodes negative. Subsequently PET/CT showed an area of concern in the right mandibular area with 2 suspicious lymph nodes. Had needle biopsy which confirmed recurrent disease. Had a course of chemotherapy and RT starting last August. With therapy he has noted an increase in BP and amlodipine was increased. He has since been shown to have persistent cancer on PET/CT and biopsy at [facility] confirmed persistent SSCA. Now starting immumotherapy at [facility].  From a cardiac standpoint he denies any chest pain, SOB, or edema. Weight is stable but he does have intermittent nausea. Having a hard time managing pain without feeling very drowsy. Seeing pain management in [facility] as well.   Past Medical History Past Medical History:    Diagnosis Date  . CAD (coronary artery disease)    a. 02/2013: NSTEMI -> cath 2nd PLOM branch with L-R collaterals, for medical rx. Consider PCI if refractory sx.  . Dyslipidemia   . Hospital acquired PNA   . Hypertension   . Ischemic cardiomyopathy    a. 02/2013 adm - EF 45-50% by echo, 55-65% by cath.  . Leukocytosis    a. During 03/2103 adm. May need OP recheck.  . Meningioma ([place])   . Nocturnal oxygen desaturation    a. Instructed to f/u PCP for possible sleep study.  . Situational stress     Past Surgical History None  Allergies/Intolerances Allergies  Allergen Reactions  . Codeine Rash  . Penicillins Rash  . Sulfa Antibiotics Rash    Current Home Medications Current Outpatient Medications  Medication Sig Dispense Refill  . amLODipine (NORVASC) 10 MG tablet Take 10 mg by mouth daily.    Marland Kitchen aspirin EC 81 MG tablet Take 325 mg by mouth daily.     Marland Kitchen atorvastatin (LIPITOR) 20 MG tablet Take 1 tablet (20 mg total) by mouth daily at 6 PM. 90 tablet 3  . escitalopram (LEXAPRO) 5 MG tablet Take 10 mg by mouth daily.     . isosorbide mononitrate (IMDUR) 30 MG 24 hr tablet Take 1 tablet (30 mg total) by mouth daily. 90 tablet 2  . METHADONE HCL PO Take 2.5 mg by mouth 3 (three) times daily.    . Multiple Vitamin (MULTIVITAMIN WITH MINERALS) TABS tablet Take 1 tablet by mouth daily.    Marland Kitchen  nitroGLYCERIN (NITROSTAT) 0.4 MG SL tablet Place 1 tablet (0.4 mg total) under the tongue every 5 (five) minutes as needed for chest pain (up to 3 doses). 25 tablet 3  . oxyCODONE (ROXICODONE) 15 MG immediate release tablet Take 15 mg by mouth as needed.    . Pregabalin (LYRICA PO) Take 1 tablet by mouth 2 (two) times daily.     No current facility-administered medications for this visit.     Social History Social History   Socioeconomic History  . Marital status: Married    Spouse name: Not on file  . Number of children: Not on file  . Years of education: Not on file  . Highest  education level: Not on file  Social Needs  . Financial resource strain: Not on file  . Food insecurity - worry: Not on file  . Food insecurity - inability: Not on file  . Transportation needs - medical: Not on file  . Transportation needs - non-medical: Not on file  Occupational History  . Not on file  Tobacco Use  . Smoking status: Never Smoker  . Smokeless tobacco: Never Used  . Tobacco comment: has had occasional cigar.  Substance and Sexual Activity  . Alcohol use: Yes    Comment: 2 drinks/year  . Drug use: No  . Sexual activity: Yes  Other Topics Concern  . Not on file  Social History Narrative   Lives in [place] with his wife.  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  Not currently exercising on routine basis.     Review of Systems As noted in HPI. All other systems reviewed and are otherwise negative except as noted above.  Physical Exam Vitals: Blood pressure 110/62, pulse 86, height 5\' 8"  (1.727 m), weight 160 lb (72.6 kg).  GENERAL:  Well appearing WM in NAD HEENT:  PERRL, EOMI, sclera are clear. Right cheek is swollen. S/p right neck dissection. NECK:  No jugular venous distention, carotid upstroke brisk and symmetric, no bruits, no thyromegaly or adenopathy LUNGS:  Clear to auscultation bilaterally CHEST:  Unremarkable HEART:  RRR,  PMI not displaced or sustained,S1 and S2 within normal limits, no S3, no S4: no clicks, no rubs, no murmurs ABD:  Soft, nontender. BS +, no masses or bruits. No hepatomegaly, no splenomegaly EXT:  2 + pulses throughout, no edema, no cyanosis no clubbing SKIN:  Warm and dry.  No rashes NEURO:  Alert and oriented x 3. Cranial nerves II through XII intact. PSYCH:  Cognitively intact      Laboratory data:  Lab Results  Component Value Date   WBC 12.1 (H) 03/29/2013   HGB 14.6 02/13/2014   HCT 34.4 (L) 03/29/2013   PLT 238 03/29/2013   GLUCOSE 84 02/08/2014   CHOL 146 03/28/2013   TRIG 138 03/28/2013   HDL 38 (L) 03/28/2013   LDLCALC  80 03/28/2013   ALT 23 03/27/2013   AST 30 03/27/2013   NA 138 02/08/2014   K 3.6 (L) 02/08/2014   CL 95 (L) 02/08/2014   CREATININE 1.08 02/08/2014   BUN 17 02/08/2014   CO2 30 02/08/2014   TSH 1.239 03/27/2013   INR 1.00 03/29/2013   HGBA1C 5.6 03/27/2013   Labs dated 01/07/16: cholesterol 106, triglycerides 79, LDL 51, HDL 39. CMET, TSH normal. Hgb 14.5 Dated 02/28/16: normal BMET, WBC 13.2. Hgb 9.7 Dated 09/02/16: creatinine1.1. Dated 07/14/16: cholesterol 104, triglycerides 82, HDL 44, LDL 44. CMET normal. Dated 12/12/16: sodium 133, glucose 235. Otherwise CMET normal.  Hgb 11.9 Dated 01/26/17: cholesterol 129, triglycerides 104. HDL 46, LDL 62, Hgb 11.9.  Dated 03/16/17: creatinine 1.23.    Assessment and Plan 1.  CAD s/p  NSTEMI 03/14/13, currently asymptomatic. - 2nd PLOM branch 90% with L-R collaterals, medical management recommended - continue medical therapy with ASA and Imdur.   2.  HTN - BP well controlled today. - continue current regimen. If related to immunotherapy BP should drop we can reduce dose of amlodipine.   3. Dyslipidemia-  continue statin   4. SSCA of the tongue s/p resection. Now with recurrence. S/p modified radical neck resection/ RT/Chemo. Starting immunotherapy now.    5. S/p resection of meningioma  I will follow up in 6 months.

## 2017-06-08 ENCOUNTER — Ambulatory Visit (INDEPENDENT_AMBULATORY_CARE_PROVIDER_SITE_OTHER): Payer: Medicare Other | Admitting: Cardiology

## 2017-06-08 ENCOUNTER — Encounter: Payer: Self-pay | Admitting: Cardiology

## 2017-06-08 VITALS — BP 110/62 | HR 86 | Ht 68.0 in | Wt 160.0 lb

## 2017-06-08 DIAGNOSIS — I10 Essential (primary) hypertension: Secondary | ICD-10-CM

## 2017-06-08 DIAGNOSIS — I2510 Atherosclerotic heart disease of native coronary artery without angina pectoris: Secondary | ICD-10-CM | POA: Diagnosis not present

## 2017-06-08 DIAGNOSIS — E785 Hyperlipidemia, unspecified: Secondary | ICD-10-CM

## 2017-06-08 NOTE — Patient Instructions (Signed)
Continue your current therapy  I will see you in 6 months.   

## 2017-06-09 DIAGNOSIS — Z79899 Other long term (current) drug therapy: Secondary | ICD-10-CM | POA: Diagnosis not present

## 2017-06-09 DIAGNOSIS — Z882 Allergy status to sulfonamides status: Secondary | ICD-10-CM | POA: Diagnosis not present

## 2017-06-09 DIAGNOSIS — C760 Malignant neoplasm of head, face and neck: Secondary | ICD-10-CM | POA: Diagnosis not present

## 2017-06-09 DIAGNOSIS — Z880 Allergy status to penicillin: Secondary | ICD-10-CM | POA: Diagnosis not present

## 2017-06-09 DIAGNOSIS — G893 Neoplasm related pain (acute) (chronic): Secondary | ICD-10-CM | POA: Diagnosis not present

## 2017-06-09 DIAGNOSIS — Z885 Allergy status to narcotic agent status: Secondary | ICD-10-CM | POA: Diagnosis not present

## 2017-06-16 DIAGNOSIS — F329 Major depressive disorder, single episode, unspecified: Secondary | ICD-10-CM | POA: Diagnosis not present

## 2017-06-16 DIAGNOSIS — C760 Malignant neoplasm of head, face and neck: Secondary | ICD-10-CM | POA: Diagnosis not present

## 2017-06-16 DIAGNOSIS — L409 Psoriasis, unspecified: Secondary | ICD-10-CM | POA: Diagnosis not present

## 2017-06-16 DIAGNOSIS — G893 Neoplasm related pain (acute) (chronic): Secondary | ICD-10-CM | POA: Diagnosis not present

## 2017-06-16 DIAGNOSIS — Z9225 Personal history of immunosupression therapy: Secondary | ICD-10-CM | POA: Diagnosis not present

## 2017-06-16 DIAGNOSIS — Z882 Allergy status to sulfonamides status: Secondary | ICD-10-CM | POA: Diagnosis not present

## 2017-06-16 DIAGNOSIS — Z880 Allergy status to penicillin: Secondary | ICD-10-CM | POA: Diagnosis not present

## 2017-06-16 DIAGNOSIS — Z7982 Long term (current) use of aspirin: Secondary | ICD-10-CM | POA: Diagnosis not present

## 2017-06-16 DIAGNOSIS — Z885 Allergy status to narcotic agent status: Secondary | ICD-10-CM | POA: Diagnosis not present

## 2017-06-16 DIAGNOSIS — R590 Localized enlarged lymph nodes: Secondary | ICD-10-CM | POA: Diagnosis not present

## 2017-06-16 DIAGNOSIS — Z6824 Body mass index (BMI) 24.0-24.9, adult: Secondary | ICD-10-CM | POA: Diagnosis not present

## 2017-06-16 DIAGNOSIS — G629 Polyneuropathy, unspecified: Secondary | ICD-10-CM | POA: Diagnosis not present

## 2017-06-16 DIAGNOSIS — Z923 Personal history of irradiation: Secondary | ICD-10-CM | POA: Diagnosis not present

## 2017-06-16 DIAGNOSIS — R110 Nausea: Secondary | ICD-10-CM | POA: Diagnosis not present

## 2017-06-16 DIAGNOSIS — R9431 Abnormal electrocardiogram [ECG] [EKG]: Secondary | ICD-10-CM | POA: Diagnosis not present

## 2017-06-16 DIAGNOSIS — Z79891 Long term (current) use of opiate analgesic: Secondary | ICD-10-CM | POA: Diagnosis not present

## 2017-06-16 DIAGNOSIS — Z79899 Other long term (current) drug therapy: Secondary | ICD-10-CM | POA: Diagnosis not present

## 2017-06-16 DIAGNOSIS — K5900 Constipation, unspecified: Secondary | ICD-10-CM | POA: Diagnosis not present

## 2017-06-16 DIAGNOSIS — I2510 Atherosclerotic heart disease of native coronary artery without angina pectoris: Secondary | ICD-10-CM | POA: Diagnosis not present

## 2017-06-16 DIAGNOSIS — D320 Benign neoplasm of cerebral meninges: Secondary | ICD-10-CM | POA: Diagnosis not present

## 2017-06-16 DIAGNOSIS — R400 Somnolence: Secondary | ICD-10-CM | POA: Diagnosis not present

## 2017-06-16 DIAGNOSIS — Z9221 Personal history of antineoplastic chemotherapy: Secondary | ICD-10-CM | POA: Diagnosis not present

## 2017-06-22 DIAGNOSIS — C760 Malignant neoplasm of head, face and neck: Secondary | ICD-10-CM | POA: Diagnosis not present

## 2017-06-22 DIAGNOSIS — Z6824 Body mass index (BMI) 24.0-24.9, adult: Secondary | ICD-10-CM | POA: Diagnosis not present

## 2017-06-22 DIAGNOSIS — Z882 Allergy status to sulfonamides status: Secondary | ICD-10-CM | POA: Diagnosis not present

## 2017-06-22 DIAGNOSIS — I2510 Atherosclerotic heart disease of native coronary artery without angina pectoris: Secondary | ICD-10-CM | POA: Diagnosis not present

## 2017-06-22 DIAGNOSIS — Z79899 Other long term (current) drug therapy: Secondary | ICD-10-CM | POA: Diagnosis not present

## 2017-06-22 DIAGNOSIS — Z7982 Long term (current) use of aspirin: Secondary | ICD-10-CM | POA: Diagnosis not present

## 2017-06-22 DIAGNOSIS — F329 Major depressive disorder, single episode, unspecified: Secondary | ICD-10-CM | POA: Diagnosis not present

## 2017-06-22 DIAGNOSIS — R252 Cramp and spasm: Secondary | ICD-10-CM | POA: Diagnosis not present

## 2017-06-22 DIAGNOSIS — Z885 Allergy status to narcotic agent status: Secondary | ICD-10-CM | POA: Diagnosis not present

## 2017-06-22 DIAGNOSIS — L409 Psoriasis, unspecified: Secondary | ICD-10-CM | POA: Diagnosis not present

## 2017-06-22 DIAGNOSIS — Z8041 Family history of malignant neoplasm of ovary: Secondary | ICD-10-CM | POA: Diagnosis not present

## 2017-06-22 DIAGNOSIS — R630 Anorexia: Secondary | ICD-10-CM | POA: Diagnosis not present

## 2017-06-22 DIAGNOSIS — R110 Nausea: Secondary | ICD-10-CM | POA: Diagnosis not present

## 2017-06-22 DIAGNOSIS — Z880 Allergy status to penicillin: Secondary | ICD-10-CM | POA: Diagnosis not present

## 2017-06-29 DIAGNOSIS — C760 Malignant neoplasm of head, face and neck: Secondary | ICD-10-CM | POA: Diagnosis not present

## 2017-06-29 DIAGNOSIS — Z5112 Encounter for antineoplastic immunotherapy: Secondary | ICD-10-CM | POA: Diagnosis not present

## 2017-07-09 DIAGNOSIS — D49512 Neoplasm of unspecified behavior of left kidney: Secondary | ICD-10-CM | POA: Diagnosis not present

## 2017-07-09 DIAGNOSIS — M6250 Muscle wasting and atrophy, not elsewhere classified, unspecified site: Secondary | ICD-10-CM | POA: Diagnosis not present

## 2017-07-09 DIAGNOSIS — R220 Localized swelling, mass and lump, head: Secondary | ICD-10-CM | POA: Diagnosis not present

## 2017-07-09 DIAGNOSIS — G936 Cerebral edema: Secondary | ICD-10-CM | POA: Diagnosis not present

## 2017-07-09 DIAGNOSIS — R918 Other nonspecific abnormal finding of lung field: Secondary | ICD-10-CM | POA: Diagnosis not present

## 2017-07-09 DIAGNOSIS — Z743 Need for continuous supervision: Secondary | ICD-10-CM | POA: Diagnosis not present

## 2017-07-09 DIAGNOSIS — R200 Anesthesia of skin: Secondary | ICD-10-CM | POA: Diagnosis not present

## 2017-07-09 DIAGNOSIS — G610 Guillain-Barre syndrome: Secondary | ICD-10-CM | POA: Diagnosis not present

## 2017-07-09 DIAGNOSIS — D329 Benign neoplasm of meninges, unspecified: Secondary | ICD-10-CM | POA: Diagnosis present

## 2017-07-09 DIAGNOSIS — F419 Anxiety disorder, unspecified: Secondary | ICD-10-CM | POA: Diagnosis present

## 2017-07-09 DIAGNOSIS — Z6823 Body mass index (BMI) 23.0-23.9, adult: Secondary | ICD-10-CM | POA: Diagnosis not present

## 2017-07-09 DIAGNOSIS — R279 Unspecified lack of coordination: Secondary | ICD-10-CM | POA: Diagnosis not present

## 2017-07-09 DIAGNOSIS — R509 Fever, unspecified: Secondary | ICD-10-CM | POA: Diagnosis not present

## 2017-07-09 DIAGNOSIS — Z9221 Personal history of antineoplastic chemotherapy: Secondary | ICD-10-CM | POA: Diagnosis not present

## 2017-07-09 DIAGNOSIS — Z79899 Other long term (current) drug therapy: Secondary | ICD-10-CM | POA: Diagnosis not present

## 2017-07-09 DIAGNOSIS — C760 Malignant neoplasm of head, face and neck: Secondary | ICD-10-CM | POA: Diagnosis not present

## 2017-07-09 DIAGNOSIS — C7839 Secondary malignant neoplasm of other respiratory organs: Secondary | ICD-10-CM | POA: Diagnosis present

## 2017-07-09 DIAGNOSIS — G111 Early-onset cerebellar ataxia: Secondary | ICD-10-CM | POA: Diagnosis present

## 2017-07-09 DIAGNOSIS — R94131 Abnormal electromyogram [EMG]: Secondary | ICD-10-CM | POA: Diagnosis not present

## 2017-07-09 DIAGNOSIS — R531 Weakness: Secondary | ICD-10-CM | POA: Diagnosis not present

## 2017-07-09 DIAGNOSIS — R208 Other disturbances of skin sensation: Secondary | ICD-10-CM | POA: Diagnosis not present

## 2017-07-09 DIAGNOSIS — E860 Dehydration: Secondary | ICD-10-CM | POA: Diagnosis not present

## 2017-07-09 DIAGNOSIS — N2889 Other specified disorders of kidney and ureter: Secondary | ICD-10-CM | POA: Diagnosis present

## 2017-07-09 DIAGNOSIS — R110 Nausea: Secondary | ICD-10-CM | POA: Diagnosis not present

## 2017-07-09 DIAGNOSIS — Z515 Encounter for palliative care: Secondary | ICD-10-CM | POA: Diagnosis present

## 2017-07-09 DIAGNOSIS — M6281 Muscle weakness (generalized): Secondary | ICD-10-CM | POA: Diagnosis not present

## 2017-07-09 DIAGNOSIS — R9431 Abnormal electrocardiogram [ECG] [EKG]: Secondary | ICD-10-CM | POA: Diagnosis not present

## 2017-07-09 DIAGNOSIS — R0902 Hypoxemia: Secondary | ICD-10-CM | POA: Diagnosis not present

## 2017-07-09 DIAGNOSIS — Z882 Allergy status to sulfonamides status: Secondary | ICD-10-CM | POA: Diagnosis not present

## 2017-07-09 DIAGNOSIS — C649 Malignant neoplasm of unspecified kidney, except renal pelvis: Secondary | ICD-10-CM | POA: Diagnosis present

## 2017-07-09 DIAGNOSIS — C7989 Secondary malignant neoplasm of other specified sites: Secondary | ICD-10-CM | POA: Diagnosis present

## 2017-07-09 DIAGNOSIS — R292 Abnormal reflex: Secondary | ICD-10-CM | POA: Diagnosis not present

## 2017-07-09 DIAGNOSIS — E871 Hypo-osmolality and hyponatremia: Secondary | ICD-10-CM | POA: Diagnosis present

## 2017-07-09 DIAGNOSIS — I10 Essential (primary) hypertension: Secondary | ICD-10-CM | POA: Diagnosis present

## 2017-07-09 DIAGNOSIS — R109 Unspecified abdominal pain: Secondary | ICD-10-CM | POA: Diagnosis not present

## 2017-07-09 DIAGNOSIS — R2689 Other abnormalities of gait and mobility: Secondary | ICD-10-CM | POA: Diagnosis not present

## 2017-07-09 DIAGNOSIS — G893 Neoplasm related pain (acute) (chronic): Secondary | ICD-10-CM | POA: Diagnosis not present

## 2017-07-09 DIAGNOSIS — M5410 Radiculopathy, site unspecified: Secondary | ICD-10-CM | POA: Diagnosis not present

## 2017-07-09 DIAGNOSIS — G92 Toxic encephalopathy: Secondary | ICD-10-CM | POA: Diagnosis not present

## 2017-07-09 DIAGNOSIS — J9811 Atelectasis: Secondary | ICD-10-CM | POA: Diagnosis not present

## 2017-07-09 DIAGNOSIS — C490 Malignant neoplasm of connective and soft tissue of head, face and neck: Secondary | ICD-10-CM | POA: Diagnosis present

## 2017-07-09 DIAGNOSIS — Z9889 Other specified postprocedural states: Secondary | ICD-10-CM | POA: Diagnosis not present

## 2017-07-09 DIAGNOSIS — Z6821 Body mass index (BMI) 21.0-21.9, adult: Secondary | ICD-10-CM | POA: Diagnosis not present

## 2017-07-09 DIAGNOSIS — T402X5A Adverse effect of other opioids, initial encounter: Secondary | ICD-10-CM | POA: Diagnosis present

## 2017-07-09 DIAGNOSIS — G9340 Encephalopathy, unspecified: Secondary | ICD-10-CM | POA: Diagnosis not present

## 2017-07-09 DIAGNOSIS — T451X5A Adverse effect of antineoplastic and immunosuppressive drugs, initial encounter: Secondary | ICD-10-CM | POA: Diagnosis present

## 2017-07-09 DIAGNOSIS — E876 Hypokalemia: Secondary | ICD-10-CM | POA: Diagnosis present

## 2017-07-09 DIAGNOSIS — Z66 Do not resuscitate: Secondary | ICD-10-CM | POA: Diagnosis not present

## 2017-07-09 DIAGNOSIS — R42 Dizziness and giddiness: Secondary | ICD-10-CM | POA: Diagnosis not present

## 2017-07-09 DIAGNOSIS — Z880 Allergy status to penicillin: Secondary | ICD-10-CM | POA: Diagnosis not present

## 2017-07-09 DIAGNOSIS — Z8589 Personal history of malignant neoplasm of other organs and systems: Secondary | ICD-10-CM | POA: Diagnosis not present

## 2017-07-09 DIAGNOSIS — G253 Myoclonus: Secondary | ICD-10-CM | POA: Diagnosis not present

## 2017-07-09 DIAGNOSIS — R5381 Other malaise: Secondary | ICD-10-CM | POA: Diagnosis not present

## 2017-07-09 DIAGNOSIS — E440 Moderate protein-calorie malnutrition: Secondary | ICD-10-CM | POA: Diagnosis present

## 2017-07-09 DIAGNOSIS — G629 Polyneuropathy, unspecified: Secondary | ICD-10-CM | POA: Diagnosis not present

## 2017-07-09 DIAGNOSIS — Z6820 Body mass index (BMI) 20.0-20.9, adult: Secondary | ICD-10-CM | POA: Diagnosis not present

## 2017-07-09 DIAGNOSIS — R930 Abnormal findings on diagnostic imaging of skull and head, not elsewhere classified: Secondary | ICD-10-CM | POA: Diagnosis not present

## 2017-07-09 DIAGNOSIS — R2681 Unsteadiness on feet: Secondary | ICD-10-CM | POA: Diagnosis not present

## 2017-07-09 DIAGNOSIS — K5903 Drug induced constipation: Secondary | ICD-10-CM | POA: Diagnosis not present

## 2017-07-09 DIAGNOSIS — R270 Ataxia, unspecified: Secondary | ICD-10-CM | POA: Diagnosis not present

## 2017-07-13 DIAGNOSIS — M6281 Muscle weakness (generalized): Secondary | ICD-10-CM | POA: Diagnosis not present

## 2017-07-28 DIAGNOSIS — R531 Weakness: Secondary | ICD-10-CM | POA: Diagnosis not present

## 2017-07-28 DIAGNOSIS — R94131 Abnormal electromyogram [EMG]: Secondary | ICD-10-CM | POA: Diagnosis not present

## 2017-07-28 DIAGNOSIS — Z9221 Personal history of antineoplastic chemotherapy: Secondary | ICD-10-CM | POA: Diagnosis not present

## 2017-07-28 DIAGNOSIS — G629 Polyneuropathy, unspecified: Secondary | ICD-10-CM | POA: Diagnosis not present

## 2017-07-28 DIAGNOSIS — G610 Guillain-Barre syndrome: Secondary | ICD-10-CM | POA: Diagnosis not present

## 2017-07-28 DIAGNOSIS — C760 Malignant neoplasm of head, face and neck: Secondary | ICD-10-CM | POA: Diagnosis not present

## 2017-08-06 DIAGNOSIS — R110 Nausea: Secondary | ICD-10-CM | POA: Diagnosis not present

## 2017-08-06 DIAGNOSIS — I10 Essential (primary) hypertension: Secondary | ICD-10-CM | POA: Diagnosis not present

## 2017-08-06 DIAGNOSIS — E860 Dehydration: Secondary | ICD-10-CM | POA: Diagnosis not present

## 2017-08-06 DIAGNOSIS — M6281 Muscle weakness (generalized): Secondary | ICD-10-CM | POA: Diagnosis not present

## 2017-08-06 DIAGNOSIS — C760 Malignant neoplasm of head, face and neck: Secondary | ICD-10-CM | POA: Diagnosis not present

## 2017-08-10 DIAGNOSIS — C760 Malignant neoplasm of head, face and neck: Secondary | ICD-10-CM | POA: Diagnosis not present

## 2017-08-10 DIAGNOSIS — I10 Essential (primary) hypertension: Secondary | ICD-10-CM | POA: Diagnosis not present

## 2017-08-10 DIAGNOSIS — R110 Nausea: Secondary | ICD-10-CM | POA: Diagnosis not present

## 2017-08-10 DIAGNOSIS — M6281 Muscle weakness (generalized): Secondary | ICD-10-CM | POA: Diagnosis not present

## 2017-08-10 DIAGNOSIS — E860 Dehydration: Secondary | ICD-10-CM | POA: Diagnosis not present

## 2017-08-11 DIAGNOSIS — E860 Dehydration: Secondary | ICD-10-CM | POA: Diagnosis not present

## 2017-08-11 DIAGNOSIS — R110 Nausea: Secondary | ICD-10-CM | POA: Diagnosis not present

## 2017-08-11 DIAGNOSIS — M6281 Muscle weakness (generalized): Secondary | ICD-10-CM | POA: Diagnosis not present

## 2017-08-11 DIAGNOSIS — I10 Essential (primary) hypertension: Secondary | ICD-10-CM | POA: Diagnosis not present

## 2017-08-11 DIAGNOSIS — C760 Malignant neoplasm of head, face and neck: Secondary | ICD-10-CM | POA: Diagnosis not present

## 2017-08-12 DIAGNOSIS — C760 Malignant neoplasm of head, face and neck: Secondary | ICD-10-CM | POA: Diagnosis not present

## 2017-08-12 DIAGNOSIS — M6281 Muscle weakness (generalized): Secondary | ICD-10-CM | POA: Diagnosis not present

## 2017-08-12 DIAGNOSIS — R110 Nausea: Secondary | ICD-10-CM | POA: Diagnosis not present

## 2017-08-12 DIAGNOSIS — E860 Dehydration: Secondary | ICD-10-CM | POA: Diagnosis not present

## 2017-08-12 DIAGNOSIS — I10 Essential (primary) hypertension: Secondary | ICD-10-CM | POA: Diagnosis not present

## 2017-08-13 DIAGNOSIS — R110 Nausea: Secondary | ICD-10-CM | POA: Diagnosis not present

## 2017-08-13 DIAGNOSIS — E860 Dehydration: Secondary | ICD-10-CM | POA: Diagnosis not present

## 2017-08-13 DIAGNOSIS — I10 Essential (primary) hypertension: Secondary | ICD-10-CM | POA: Diagnosis not present

## 2017-08-13 DIAGNOSIS — M6281 Muscle weakness (generalized): Secondary | ICD-10-CM | POA: Diagnosis not present

## 2017-08-13 DIAGNOSIS — C760 Malignant neoplasm of head, face and neck: Secondary | ICD-10-CM | POA: Diagnosis not present

## 2017-08-14 DIAGNOSIS — E860 Dehydration: Secondary | ICD-10-CM | POA: Diagnosis not present

## 2017-08-14 DIAGNOSIS — C760 Malignant neoplasm of head, face and neck: Secondary | ICD-10-CM | POA: Diagnosis not present

## 2017-08-14 DIAGNOSIS — I10 Essential (primary) hypertension: Secondary | ICD-10-CM | POA: Diagnosis not present

## 2017-08-14 DIAGNOSIS — M6281 Muscle weakness (generalized): Secondary | ICD-10-CM | POA: Diagnosis not present

## 2017-08-14 DIAGNOSIS — R110 Nausea: Secondary | ICD-10-CM | POA: Diagnosis not present

## 2017-08-17 DIAGNOSIS — R110 Nausea: Secondary | ICD-10-CM | POA: Diagnosis not present

## 2017-08-17 DIAGNOSIS — E860 Dehydration: Secondary | ICD-10-CM | POA: Diagnosis not present

## 2017-08-17 DIAGNOSIS — I10 Essential (primary) hypertension: Secondary | ICD-10-CM | POA: Diagnosis not present

## 2017-08-17 DIAGNOSIS — C760 Malignant neoplasm of head, face and neck: Secondary | ICD-10-CM | POA: Diagnosis not present

## 2017-08-17 DIAGNOSIS — M6281 Muscle weakness (generalized): Secondary | ICD-10-CM | POA: Diagnosis not present

## 2017-08-18 DIAGNOSIS — C760 Malignant neoplasm of head, face and neck: Secondary | ICD-10-CM | POA: Diagnosis not present

## 2017-08-18 DIAGNOSIS — E860 Dehydration: Secondary | ICD-10-CM | POA: Diagnosis not present

## 2017-08-18 DIAGNOSIS — R110 Nausea: Secondary | ICD-10-CM | POA: Diagnosis not present

## 2017-08-18 DIAGNOSIS — M6281 Muscle weakness (generalized): Secondary | ICD-10-CM | POA: Diagnosis not present

## 2017-08-18 DIAGNOSIS — I10 Essential (primary) hypertension: Secondary | ICD-10-CM | POA: Diagnosis not present

## 2017-08-19 DIAGNOSIS — I10 Essential (primary) hypertension: Secondary | ICD-10-CM | POA: Diagnosis not present

## 2017-08-19 DIAGNOSIS — E860 Dehydration: Secondary | ICD-10-CM | POA: Diagnosis not present

## 2017-08-19 DIAGNOSIS — M6281 Muscle weakness (generalized): Secondary | ICD-10-CM | POA: Diagnosis not present

## 2017-08-19 DIAGNOSIS — R110 Nausea: Secondary | ICD-10-CM | POA: Diagnosis not present

## 2017-08-19 DIAGNOSIS — C760 Malignant neoplasm of head, face and neck: Secondary | ICD-10-CM | POA: Diagnosis not present

## 2017-08-20 DIAGNOSIS — E860 Dehydration: Secondary | ICD-10-CM | POA: Diagnosis not present

## 2017-08-20 DIAGNOSIS — I10 Essential (primary) hypertension: Secondary | ICD-10-CM | POA: Diagnosis not present

## 2017-08-20 DIAGNOSIS — M6281 Muscle weakness (generalized): Secondary | ICD-10-CM | POA: Diagnosis not present

## 2017-08-20 DIAGNOSIS — C760 Malignant neoplasm of head, face and neck: Secondary | ICD-10-CM | POA: Diagnosis not present

## 2017-08-20 DIAGNOSIS — R110 Nausea: Secondary | ICD-10-CM | POA: Diagnosis not present

## 2017-08-21 DIAGNOSIS — R110 Nausea: Secondary | ICD-10-CM | POA: Diagnosis not present

## 2017-08-21 DIAGNOSIS — M6281 Muscle weakness (generalized): Secondary | ICD-10-CM | POA: Diagnosis not present

## 2017-08-21 DIAGNOSIS — C760 Malignant neoplasm of head, face and neck: Secondary | ICD-10-CM | POA: Diagnosis not present

## 2017-08-21 DIAGNOSIS — E860 Dehydration: Secondary | ICD-10-CM | POA: Diagnosis not present

## 2017-08-21 DIAGNOSIS — I10 Essential (primary) hypertension: Secondary | ICD-10-CM | POA: Diagnosis not present

## 2017-08-25 DIAGNOSIS — C760 Malignant neoplasm of head, face and neck: Secondary | ICD-10-CM | POA: Diagnosis not present

## 2017-08-25 DIAGNOSIS — E860 Dehydration: Secondary | ICD-10-CM | POA: Diagnosis not present

## 2017-08-25 DIAGNOSIS — R110 Nausea: Secondary | ICD-10-CM | POA: Diagnosis not present

## 2017-08-25 DIAGNOSIS — I10 Essential (primary) hypertension: Secondary | ICD-10-CM | POA: Diagnosis not present

## 2017-08-25 DIAGNOSIS — M6281 Muscle weakness (generalized): Secondary | ICD-10-CM | POA: Diagnosis not present

## 2017-08-26 DIAGNOSIS — I10 Essential (primary) hypertension: Secondary | ICD-10-CM | POA: Diagnosis not present

## 2017-08-26 DIAGNOSIS — C760 Malignant neoplasm of head, face and neck: Secondary | ICD-10-CM | POA: Diagnosis not present

## 2017-08-26 DIAGNOSIS — R110 Nausea: Secondary | ICD-10-CM | POA: Diagnosis not present

## 2017-08-26 DIAGNOSIS — E860 Dehydration: Secondary | ICD-10-CM | POA: Diagnosis not present

## 2017-08-26 DIAGNOSIS — M6281 Muscle weakness (generalized): Secondary | ICD-10-CM | POA: Diagnosis not present

## 2017-08-27 DIAGNOSIS — C760 Malignant neoplasm of head, face and neck: Secondary | ICD-10-CM | POA: Diagnosis not present

## 2017-08-27 DIAGNOSIS — M6281 Muscle weakness (generalized): Secondary | ICD-10-CM | POA: Diagnosis not present

## 2017-08-27 DIAGNOSIS — E860 Dehydration: Secondary | ICD-10-CM | POA: Diagnosis not present

## 2017-08-27 DIAGNOSIS — I10 Essential (primary) hypertension: Secondary | ICD-10-CM | POA: Diagnosis not present

## 2017-08-27 DIAGNOSIS — R110 Nausea: Secondary | ICD-10-CM | POA: Diagnosis not present

## 2017-08-28 DIAGNOSIS — I10 Essential (primary) hypertension: Secondary | ICD-10-CM | POA: Diagnosis not present

## 2017-08-28 DIAGNOSIS — C760 Malignant neoplasm of head, face and neck: Secondary | ICD-10-CM | POA: Diagnosis not present

## 2017-08-28 DIAGNOSIS — M6281 Muscle weakness (generalized): Secondary | ICD-10-CM | POA: Diagnosis not present

## 2017-08-28 DIAGNOSIS — R110 Nausea: Secondary | ICD-10-CM | POA: Diagnosis not present

## 2017-08-28 DIAGNOSIS — E860 Dehydration: Secondary | ICD-10-CM | POA: Diagnosis not present

## 2017-08-29 DIAGNOSIS — C760 Malignant neoplasm of head, face and neck: Secondary | ICD-10-CM | POA: Diagnosis not present

## 2017-08-29 DIAGNOSIS — M6281 Muscle weakness (generalized): Secondary | ICD-10-CM | POA: Diagnosis not present

## 2017-08-29 DIAGNOSIS — R110 Nausea: Secondary | ICD-10-CM | POA: Diagnosis not present

## 2017-08-29 DIAGNOSIS — I10 Essential (primary) hypertension: Secondary | ICD-10-CM | POA: Diagnosis not present

## 2017-08-29 DIAGNOSIS — E860 Dehydration: Secondary | ICD-10-CM | POA: Diagnosis not present

## 2017-08-31 DIAGNOSIS — R110 Nausea: Secondary | ICD-10-CM | POA: Diagnosis not present

## 2017-08-31 DIAGNOSIS — M6281 Muscle weakness (generalized): Secondary | ICD-10-CM | POA: Diagnosis not present

## 2017-08-31 DIAGNOSIS — E860 Dehydration: Secondary | ICD-10-CM | POA: Diagnosis not present

## 2017-08-31 DIAGNOSIS — I10 Essential (primary) hypertension: Secondary | ICD-10-CM | POA: Diagnosis not present

## 2017-08-31 DIAGNOSIS — C760 Malignant neoplasm of head, face and neck: Secondary | ICD-10-CM | POA: Diagnosis not present

## 2017-09-01 DIAGNOSIS — I10 Essential (primary) hypertension: Secondary | ICD-10-CM | POA: Diagnosis not present

## 2017-09-01 DIAGNOSIS — E860 Dehydration: Secondary | ICD-10-CM | POA: Diagnosis not present

## 2017-09-01 DIAGNOSIS — C760 Malignant neoplasm of head, face and neck: Secondary | ICD-10-CM | POA: Diagnosis not present

## 2017-09-01 DIAGNOSIS — M6281 Muscle weakness (generalized): Secondary | ICD-10-CM | POA: Diagnosis not present

## 2017-09-01 DIAGNOSIS — R110 Nausea: Secondary | ICD-10-CM | POA: Diagnosis not present

## 2017-09-02 DIAGNOSIS — R110 Nausea: Secondary | ICD-10-CM | POA: Diagnosis not present

## 2017-09-02 DIAGNOSIS — C760 Malignant neoplasm of head, face and neck: Secondary | ICD-10-CM | POA: Diagnosis not present

## 2017-09-02 DIAGNOSIS — I10 Essential (primary) hypertension: Secondary | ICD-10-CM | POA: Diagnosis not present

## 2017-09-02 DIAGNOSIS — E860 Dehydration: Secondary | ICD-10-CM | POA: Diagnosis not present

## 2017-09-02 DIAGNOSIS — M6281 Muscle weakness (generalized): Secondary | ICD-10-CM | POA: Diagnosis not present

## 2017-09-03 DIAGNOSIS — C760 Malignant neoplasm of head, face and neck: Secondary | ICD-10-CM | POA: Diagnosis not present

## 2017-09-03 DIAGNOSIS — E860 Dehydration: Secondary | ICD-10-CM | POA: Diagnosis not present

## 2017-09-03 DIAGNOSIS — R110 Nausea: Secondary | ICD-10-CM | POA: Diagnosis not present

## 2017-09-03 DIAGNOSIS — I10 Essential (primary) hypertension: Secondary | ICD-10-CM | POA: Diagnosis not present

## 2017-09-03 DIAGNOSIS — M6281 Muscle weakness (generalized): Secondary | ICD-10-CM | POA: Diagnosis not present

## 2017-09-04 DIAGNOSIS — M6281 Muscle weakness (generalized): Secondary | ICD-10-CM | POA: Diagnosis not present

## 2017-09-04 DIAGNOSIS — I10 Essential (primary) hypertension: Secondary | ICD-10-CM | POA: Diagnosis not present

## 2017-09-04 DIAGNOSIS — R110 Nausea: Secondary | ICD-10-CM | POA: Diagnosis not present

## 2017-09-04 DIAGNOSIS — C760 Malignant neoplasm of head, face and neck: Secondary | ICD-10-CM | POA: Diagnosis not present

## 2017-09-04 DIAGNOSIS — E860 Dehydration: Secondary | ICD-10-CM | POA: Diagnosis not present

## 2017-09-07 DIAGNOSIS — R110 Nausea: Secondary | ICD-10-CM | POA: Diagnosis not present

## 2017-09-07 DIAGNOSIS — E860 Dehydration: Secondary | ICD-10-CM | POA: Diagnosis not present

## 2017-09-07 DIAGNOSIS — I10 Essential (primary) hypertension: Secondary | ICD-10-CM | POA: Diagnosis not present

## 2017-09-07 DIAGNOSIS — C760 Malignant neoplasm of head, face and neck: Secondary | ICD-10-CM | POA: Diagnosis not present

## 2017-09-07 DIAGNOSIS — M6281 Muscle weakness (generalized): Secondary | ICD-10-CM | POA: Diagnosis not present

## 2017-09-08 DIAGNOSIS — M6281 Muscle weakness (generalized): Secondary | ICD-10-CM | POA: Diagnosis not present

## 2017-09-08 DIAGNOSIS — C760 Malignant neoplasm of head, face and neck: Secondary | ICD-10-CM | POA: Diagnosis not present

## 2017-09-08 DIAGNOSIS — E860 Dehydration: Secondary | ICD-10-CM | POA: Diagnosis not present

## 2017-09-08 DIAGNOSIS — I10 Essential (primary) hypertension: Secondary | ICD-10-CM | POA: Diagnosis not present

## 2017-09-08 DIAGNOSIS — R110 Nausea: Secondary | ICD-10-CM | POA: Diagnosis not present

## 2017-09-09 DIAGNOSIS — R110 Nausea: Secondary | ICD-10-CM | POA: Diagnosis not present

## 2017-09-09 DIAGNOSIS — C760 Malignant neoplasm of head, face and neck: Secondary | ICD-10-CM | POA: Diagnosis not present

## 2017-09-09 DIAGNOSIS — M6281 Muscle weakness (generalized): Secondary | ICD-10-CM | POA: Diagnosis not present

## 2017-09-09 DIAGNOSIS — I10 Essential (primary) hypertension: Secondary | ICD-10-CM | POA: Diagnosis not present

## 2017-09-09 DIAGNOSIS — E860 Dehydration: Secondary | ICD-10-CM | POA: Diagnosis not present

## 2017-09-10 DIAGNOSIS — E860 Dehydration: Secondary | ICD-10-CM | POA: Diagnosis not present

## 2017-09-10 DIAGNOSIS — I10 Essential (primary) hypertension: Secondary | ICD-10-CM | POA: Diagnosis not present

## 2017-09-10 DIAGNOSIS — C760 Malignant neoplasm of head, face and neck: Secondary | ICD-10-CM | POA: Diagnosis not present

## 2017-09-10 DIAGNOSIS — R110 Nausea: Secondary | ICD-10-CM | POA: Diagnosis not present

## 2017-09-10 DIAGNOSIS — M6281 Muscle weakness (generalized): Secondary | ICD-10-CM | POA: Diagnosis not present

## 2017-09-28 DEATH — deceased
